# Patient Record
Sex: Female | Born: 1950
Health system: Southern US, Community
[De-identification: ages and names within clinical notes are randomized; demographics above are authoritative.]

## PROBLEM LIST (undated history)

## (undated) DIAGNOSIS — E119 Type 2 diabetes mellitus without complications: Secondary | ICD-10-CM

## (undated) DIAGNOSIS — I639 Cerebral infarction, unspecified: Secondary | ICD-10-CM

## (undated) DIAGNOSIS — R569 Unspecified convulsions: Secondary | ICD-10-CM

## (undated) DIAGNOSIS — Z87442 Personal history of urinary calculi: Secondary | ICD-10-CM

## (undated) DIAGNOSIS — G43909 Migraine, unspecified, not intractable, without status migrainosus: Secondary | ICD-10-CM

## (undated) DIAGNOSIS — F32A Depression, unspecified: Secondary | ICD-10-CM

## (undated) DIAGNOSIS — E7800 Pure hypercholesterolemia, unspecified: Secondary | ICD-10-CM

## (undated) DIAGNOSIS — M48061 Spinal stenosis, lumbar region without neurogenic claudication: Secondary | ICD-10-CM

## (undated) DIAGNOSIS — J45909 Unspecified asthma, uncomplicated: Secondary | ICD-10-CM

## (undated) DIAGNOSIS — C4491 Basal cell carcinoma of skin, unspecified: Secondary | ICD-10-CM

## (undated) DIAGNOSIS — M1990 Unspecified osteoarthritis, unspecified site: Secondary | ICD-10-CM

## (undated) DIAGNOSIS — I10 Essential (primary) hypertension: Secondary | ICD-10-CM

## (undated) DIAGNOSIS — D329 Benign neoplasm of meninges, unspecified: Secondary | ICD-10-CM

## (undated) DIAGNOSIS — F329 Major depressive disorder, single episode, unspecified: Secondary | ICD-10-CM

## (undated) DIAGNOSIS — K219 Gastro-esophageal reflux disease without esophagitis: Secondary | ICD-10-CM

## (undated) HISTORY — DX: Benign neoplasm of meninges, unspecified: D32.9

## (undated) HISTORY — PX: FRACTURE SURGERY: SHX138

## (undated) HISTORY — PX: COLONOSCOPY: SHX174

## (undated) HISTORY — PX: LUMBAR DISC SURGERY: SHX700

---

## 1996-11-14 HISTORY — PX: BACK SURGERY: SHX140

## 1998-10-01 ENCOUNTER — Other Ambulatory Visit: Admission: RE | Admit: 1998-10-01 | Discharge: 1998-10-01 | Payer: Self-pay | Admitting: Gynecology

## 1999-10-21 ENCOUNTER — Other Ambulatory Visit: Admission: RE | Admit: 1999-10-21 | Discharge: 1999-10-21 | Payer: Self-pay | Admitting: Gynecology

## 2000-12-28 ENCOUNTER — Other Ambulatory Visit: Admission: RE | Admit: 2000-12-28 | Discharge: 2000-12-28 | Payer: Self-pay | Admitting: Gynecology

## 2002-02-26 ENCOUNTER — Other Ambulatory Visit: Admission: RE | Admit: 2002-02-26 | Discharge: 2002-02-26 | Payer: Self-pay | Admitting: Gynecology

## 2003-04-22 ENCOUNTER — Other Ambulatory Visit: Admission: RE | Admit: 2003-04-22 | Discharge: 2003-04-22 | Payer: Self-pay | Admitting: Gynecology

## 2004-10-11 ENCOUNTER — Other Ambulatory Visit: Admission: RE | Admit: 2004-10-11 | Discharge: 2004-10-11 | Payer: Self-pay | Admitting: Gynecology

## 2007-04-03 ENCOUNTER — Ambulatory Visit (HOSPITAL_BASED_OUTPATIENT_CLINIC_OR_DEPARTMENT_OTHER): Admission: RE | Admit: 2007-04-03 | Discharge: 2007-04-03 | Payer: Self-pay | Admitting: Orthopaedic Surgery

## 2009-12-27 ENCOUNTER — Encounter: Admission: RE | Admit: 2009-12-27 | Discharge: 2009-12-27 | Payer: Self-pay | Admitting: Rheumatology

## 2010-01-01 ENCOUNTER — Encounter: Admission: RE | Admit: 2010-01-01 | Discharge: 2010-01-01 | Payer: Self-pay | Admitting: Rheumatology

## 2010-03-05 ENCOUNTER — Encounter: Admission: RE | Admit: 2010-03-05 | Discharge: 2010-03-05 | Payer: Self-pay | Admitting: Rheumatology

## 2010-12-05 ENCOUNTER — Encounter: Payer: Self-pay | Admitting: Rheumatology

## 2011-03-29 NOTE — Op Note (Signed)
NAMESYONA, Bonnie Cook                  ACCOUNT NO.:  [id number]   MEDICAL RECORD NO.:  [id number]          PATIENT TYPE:  AMB   LOCATION:  DSC                          FACILITY:  [facility]   PHYSICIAN:  Lubertha Basque. Dalldorf, M.D.DATE OF BIRTH:  1951/08/07   DATE OF PROCEDURE:  04/03/2007  DATE OF DISCHARGE:                               OPERATIVE REPORT   PREOPERATIVE DIAGNOSIS:  Right fifth carpometacarpal dislocation.   POSTOPERATIVE DIAGNOSIS:  Right fifth carpometacarpal dislocation.   PROCEDURE:  Closed reduction and pinning right fifth carpometacarpal  dislocation.   ANESTHESIA:  General.   ATTENDING SURGEON:  Lubertha Basque. Jerl Santos, M.D.   ASSISTANT:  Lindwood Qua, P.A.   INDICATIONS FOR PROCEDURE:  The patient is a 60 year old woman who fell  a few days ago in a dog related injury and injured her right dominant  hand.  She was seen at an outside facility and noted to have an  abnormality of the hand.  We took some x-rays yesterday in the office  and it looked as though she had a complete fifth CMC dislocation.  She  is offered closed reduction versus open reduction and pinning in hopes  of reestablishing the congruity of this joint.  Informed operative  consent was obtained after discussion of possible complications of  reaction to anesthesia, infection, neurovascular injury, and loss of  reduction.   SUMMARY OF FINDINGS AND PROCEDURE:  Under general anesthesia, closed  reduction and pinning was done of a volarly displaced fifth CMC  dislocation.  This reduced fairly easily and was stabilized with two K-  wires.  We used fluoroscopy throughout the case to make appropriate  intraoperative decisions and read all these views myself.   DESCRIPTION OF PROCEDURE:  The patient was taken to the operating suite  where general anesthetic was applied without difficulty.  She was  positioned supine and prepped, draped in normal sterile fashion.  After  the administration of preop IV  Kefzol, a closed reduction of her  dislocation was performed.  We then stabilized this percutaneously with  two of the 0.045 K-wires.  One was placed from the fifth metacarpal base  into the fourth metacarpal base and a second was placed diagonally  through the base of the fifth metacarpal into the carpus.  Fluoroscopy  was used to confirm adequate reduction of the fracture and placement of  the pins.  We performed these views in several planes.  The normal  rotation of her finger was restored.  The pins were bent outside the  skin and padded with Xeroform.  We then placed some dry gauze and a  volar splint of plaster with wrist in slight extension but the fingers  all free from the MCP joints distal.  Estimated blood loss and  intraoperative fluids can be obtained from anesthesia records.  No  tourniquet was placed or inflated.   DISPOSITION:  The patient was extubated in the operating room and taken  to recovery room in stable addition.  She was to go home same-day and  follow up in the office in less than a  week.  I will contact her by  phone tonight.      Lubertha Basque Jerl Santos, M.D.  Electronically Signed     PGD/MEDQ  D:  04/03/2007  T:  04/03/2007  Job:  [id number]

## 2012-11-14 HISTORY — PX: FINGER FRACTURE SURGERY: SHX638

## 2014-11-20 DIAGNOSIS — E119 Type 2 diabetes mellitus without complications: Secondary | ICD-10-CM | POA: Diagnosis not present

## 2014-12-02 DIAGNOSIS — J40 Bronchitis, not specified as acute or chronic: Secondary | ICD-10-CM | POA: Diagnosis not present

## 2014-12-02 DIAGNOSIS — N3001 Acute cystitis with hematuria: Secondary | ICD-10-CM | POA: Diagnosis not present

## 2014-12-02 DIAGNOSIS — Z23 Encounter for immunization: Secondary | ICD-10-CM | POA: Diagnosis not present

## 2014-12-02 DIAGNOSIS — J0101 Acute recurrent maxillary sinusitis: Secondary | ICD-10-CM | POA: Diagnosis not present

## 2014-12-02 DIAGNOSIS — E538 Deficiency of other specified B group vitamins: Secondary | ICD-10-CM | POA: Diagnosis not present

## 2014-12-21 DIAGNOSIS — N3001 Acute cystitis with hematuria: Secondary | ICD-10-CM | POA: Diagnosis not present

## 2014-12-21 DIAGNOSIS — R300 Dysuria: Secondary | ICD-10-CM | POA: Diagnosis not present

## 2015-01-01 DIAGNOSIS — J40 Bronchitis, not specified as acute or chronic: Secondary | ICD-10-CM | POA: Diagnosis not present

## 2015-01-01 DIAGNOSIS — E538 Deficiency of other specified B group vitamins: Secondary | ICD-10-CM | POA: Diagnosis not present

## 2015-01-29 DIAGNOSIS — E538 Deficiency of other specified B group vitamins: Secondary | ICD-10-CM | POA: Diagnosis not present

## 2015-01-29 DIAGNOSIS — I10 Essential (primary) hypertension: Secondary | ICD-10-CM | POA: Diagnosis not present

## 2015-01-29 DIAGNOSIS — E1165 Type 2 diabetes mellitus with hyperglycemia: Secondary | ICD-10-CM | POA: Diagnosis not present

## 2015-01-29 DIAGNOSIS — M8580 Other specified disorders of bone density and structure, unspecified site: Secondary | ICD-10-CM | POA: Diagnosis not present

## 2015-01-29 DIAGNOSIS — F329 Major depressive disorder, single episode, unspecified: Secondary | ICD-10-CM | POA: Diagnosis not present

## 2015-01-29 DIAGNOSIS — E559 Vitamin D deficiency, unspecified: Secondary | ICD-10-CM | POA: Diagnosis not present

## 2015-01-29 DIAGNOSIS — J4520 Mild intermittent asthma, uncomplicated: Secondary | ICD-10-CM | POA: Diagnosis not present

## 2015-01-29 DIAGNOSIS — E782 Mixed hyperlipidemia: Secondary | ICD-10-CM | POA: Diagnosis not present

## 2015-02-13 DIAGNOSIS — E119 Type 2 diabetes mellitus without complications: Secondary | ICD-10-CM | POA: Diagnosis not present

## 2015-04-30 DIAGNOSIS — J40 Bronchitis, not specified as acute or chronic: Secondary | ICD-10-CM | POA: Diagnosis not present

## 2015-04-30 DIAGNOSIS — J329 Chronic sinusitis, unspecified: Secondary | ICD-10-CM | POA: Diagnosis not present

## 2015-05-20 DIAGNOSIS — E119 Type 2 diabetes mellitus without complications: Secondary | ICD-10-CM | POA: Diagnosis not present

## 2015-06-27 DIAGNOSIS — H25813 Combined forms of age-related cataract, bilateral: Secondary | ICD-10-CM | POA: Diagnosis not present

## 2015-06-27 DIAGNOSIS — E119 Type 2 diabetes mellitus without complications: Secondary | ICD-10-CM | POA: Diagnosis not present

## 2015-06-27 DIAGNOSIS — H5203 Hypermetropia, bilateral: Secondary | ICD-10-CM | POA: Diagnosis not present

## 2015-07-13 DIAGNOSIS — R600 Localized edema: Secondary | ICD-10-CM | POA: Diagnosis not present

## 2015-07-13 DIAGNOSIS — R609 Edema, unspecified: Secondary | ICD-10-CM | POA: Diagnosis not present

## 2015-07-24 DIAGNOSIS — Z1211 Encounter for screening for malignant neoplasm of colon: Secondary | ICD-10-CM | POA: Diagnosis not present

## 2015-08-04 DIAGNOSIS — Z1231 Encounter for screening mammogram for malignant neoplasm of breast: Secondary | ICD-10-CM | POA: Diagnosis not present

## 2015-08-04 DIAGNOSIS — N898 Other specified noninflammatory disorders of vagina: Secondary | ICD-10-CM | POA: Diagnosis not present

## 2015-08-04 DIAGNOSIS — Z124 Encounter for screening for malignant neoplasm of cervix: Secondary | ICD-10-CM | POA: Diagnosis not present

## 2015-08-04 DIAGNOSIS — Z01419 Encounter for gynecological examination (general) (routine) without abnormal findings: Secondary | ICD-10-CM | POA: Diagnosis not present

## 2015-08-04 DIAGNOSIS — N9489 Other specified conditions associated with female genital organs and menstrual cycle: Secondary | ICD-10-CM | POA: Diagnosis not present

## 2015-08-04 DIAGNOSIS — D281 Benign neoplasm of vagina: Secondary | ICD-10-CM | POA: Diagnosis not present

## 2015-08-04 DIAGNOSIS — Z6828 Body mass index (BMI) 28.0-28.9, adult: Secondary | ICD-10-CM | POA: Diagnosis not present

## 2015-08-20 DIAGNOSIS — E119 Type 2 diabetes mellitus without complications: Secondary | ICD-10-CM | POA: Diagnosis not present

## 2015-08-25 DIAGNOSIS — Z9181 History of falling: Secondary | ICD-10-CM | POA: Diagnosis not present

## 2015-08-25 DIAGNOSIS — F329 Major depressive disorder, single episode, unspecified: Secondary | ICD-10-CM | POA: Diagnosis not present

## 2015-08-25 DIAGNOSIS — I10 Essential (primary) hypertension: Secondary | ICD-10-CM | POA: Diagnosis not present

## 2015-08-25 DIAGNOSIS — E1165 Type 2 diabetes mellitus with hyperglycemia: Secondary | ICD-10-CM | POA: Diagnosis not present

## 2015-08-25 DIAGNOSIS — E782 Mixed hyperlipidemia: Secondary | ICD-10-CM | POA: Diagnosis not present

## 2015-08-25 DIAGNOSIS — Z23 Encounter for immunization: Secondary | ICD-10-CM | POA: Diagnosis not present

## 2015-12-22 DIAGNOSIS — J329 Chronic sinusitis, unspecified: Secondary | ICD-10-CM | POA: Diagnosis not present

## 2015-12-22 DIAGNOSIS — J40 Bronchitis, not specified as acute or chronic: Secondary | ICD-10-CM | POA: Diagnosis not present

## 2015-12-22 DIAGNOSIS — Z1389 Encounter for screening for other disorder: Secondary | ICD-10-CM | POA: Diagnosis not present

## 2016-03-01 DIAGNOSIS — I10 Essential (primary) hypertension: Secondary | ICD-10-CM | POA: Diagnosis not present

## 2016-03-01 DIAGNOSIS — J069 Acute upper respiratory infection, unspecified: Secondary | ICD-10-CM | POA: Diagnosis not present

## 2016-03-01 DIAGNOSIS — J40 Bronchitis, not specified as acute or chronic: Secondary | ICD-10-CM | POA: Diagnosis not present

## 2016-03-08 DIAGNOSIS — J189 Pneumonia, unspecified organism: Secondary | ICD-10-CM | POA: Diagnosis not present

## 2016-03-30 DIAGNOSIS — Z0000 Encounter for general adult medical examination without abnormal findings: Secondary | ICD-10-CM | POA: Diagnosis not present

## 2016-03-30 DIAGNOSIS — M2550 Pain in unspecified joint: Secondary | ICD-10-CM | POA: Diagnosis not present

## 2016-03-30 DIAGNOSIS — J159 Unspecified bacterial pneumonia: Secondary | ICD-10-CM | POA: Diagnosis not present

## 2016-08-01 DIAGNOSIS — R399 Unspecified symptoms and signs involving the genitourinary system: Secondary | ICD-10-CM | POA: Diagnosis not present

## 2016-09-10 DIAGNOSIS — E119 Type 2 diabetes mellitus without complications: Secondary | ICD-10-CM | POA: Diagnosis not present

## 2016-09-10 DIAGNOSIS — H524 Presbyopia: Secondary | ICD-10-CM | POA: Diagnosis not present

## 2016-09-10 DIAGNOSIS — H25813 Combined forms of age-related cataract, bilateral: Secondary | ICD-10-CM | POA: Diagnosis not present

## 2016-09-23 DIAGNOSIS — J0120 Acute ethmoidal sinusitis, unspecified: Secondary | ICD-10-CM | POA: Diagnosis not present

## 2016-10-12 DIAGNOSIS — Z124 Encounter for screening for malignant neoplasm of cervix: Secondary | ICD-10-CM | POA: Diagnosis not present

## 2016-10-12 DIAGNOSIS — Z01419 Encounter for gynecological examination (general) (routine) without abnormal findings: Secondary | ICD-10-CM | POA: Diagnosis not present

## 2016-10-12 DIAGNOSIS — Z6831 Body mass index (BMI) 31.0-31.9, adult: Secondary | ICD-10-CM | POA: Diagnosis not present

## 2016-10-12 DIAGNOSIS — Z1389 Encounter for screening for other disorder: Secondary | ICD-10-CM | POA: Diagnosis not present

## 2016-10-18 DIAGNOSIS — Z23 Encounter for immunization: Secondary | ICD-10-CM | POA: Diagnosis not present

## 2016-10-21 DIAGNOSIS — Z1231 Encounter for screening mammogram for malignant neoplasm of breast: Secondary | ICD-10-CM | POA: Diagnosis not present

## 2016-12-21 DIAGNOSIS — E1165 Type 2 diabetes mellitus with hyperglycemia: Secondary | ICD-10-CM | POA: Diagnosis not present

## 2016-12-21 DIAGNOSIS — E538 Deficiency of other specified B group vitamins: Secondary | ICD-10-CM | POA: Diagnosis not present

## 2016-12-21 DIAGNOSIS — I10 Essential (primary) hypertension: Secondary | ICD-10-CM | POA: Diagnosis not present

## 2016-12-21 DIAGNOSIS — F329 Major depressive disorder, single episode, unspecified: Secondary | ICD-10-CM | POA: Diagnosis not present

## 2016-12-21 DIAGNOSIS — E782 Mixed hyperlipidemia: Secondary | ICD-10-CM | POA: Diagnosis not present

## 2016-12-21 DIAGNOSIS — M2550 Pain in unspecified joint: Secondary | ICD-10-CM | POA: Diagnosis not present

## 2016-12-21 DIAGNOSIS — E559 Vitamin D deficiency, unspecified: Secondary | ICD-10-CM | POA: Diagnosis not present

## 2016-12-21 DIAGNOSIS — M8580 Other specified disorders of bone density and structure, unspecified site: Secondary | ICD-10-CM | POA: Diagnosis not present

## 2016-12-21 DIAGNOSIS — Z1389 Encounter for screening for other disorder: Secondary | ICD-10-CM | POA: Diagnosis not present

## 2016-12-21 DIAGNOSIS — Z9181 History of falling: Secondary | ICD-10-CM | POA: Diagnosis not present

## 2017-01-22 ENCOUNTER — Encounter (HOSPITAL_COMMUNITY): Payer: Self-pay

## 2017-01-22 ENCOUNTER — Emergency Department (HOSPITAL_COMMUNITY): Payer: PPO

## 2017-01-22 ENCOUNTER — Observation Stay (HOSPITAL_COMMUNITY)
Admission: EM | Admit: 2017-01-22 | Discharge: 2017-01-23 | Disposition: A | Discharge status: Home / Self Care | Payer: PPO | Attending: Internal Medicine | Admitting: Internal Medicine

## 2017-01-22 DIAGNOSIS — J45909 Unspecified asthma, uncomplicated: Secondary | ICD-10-CM | POA: Insufficient documentation

## 2017-01-22 DIAGNOSIS — Z7982 Long term (current) use of aspirin: Secondary | ICD-10-CM | POA: Insufficient documentation

## 2017-01-22 DIAGNOSIS — Z8673 Personal history of transient ischemic attack (TIA), and cerebral infarction without residual deficits: Secondary | ICD-10-CM | POA: Insufficient documentation

## 2017-01-22 DIAGNOSIS — Z6834 Body mass index (BMI) 34.0-34.9, adult: Secondary | ICD-10-CM | POA: Insufficient documentation

## 2017-01-22 DIAGNOSIS — E785 Hyperlipidemia, unspecified: Secondary | ICD-10-CM | POA: Insufficient documentation

## 2017-01-22 DIAGNOSIS — E119 Type 2 diabetes mellitus without complications: Secondary | ICD-10-CM

## 2017-01-22 DIAGNOSIS — Z880 Allergy status to penicillin: Secondary | ICD-10-CM | POA: Insufficient documentation

## 2017-01-22 DIAGNOSIS — Z823 Family history of stroke: Secondary | ICD-10-CM | POA: Insufficient documentation

## 2017-01-22 DIAGNOSIS — R4702 Dysphasia: Secondary | ICD-10-CM | POA: Diagnosis not present

## 2017-01-22 DIAGNOSIS — E7800 Pure hypercholesterolemia, unspecified: Secondary | ICD-10-CM | POA: Diagnosis not present

## 2017-01-22 DIAGNOSIS — R4701 Aphasia: Secondary | ICD-10-CM | POA: Insufficient documentation

## 2017-01-22 DIAGNOSIS — R4789 Other speech disturbances: Secondary | ICD-10-CM

## 2017-01-22 DIAGNOSIS — E669 Obesity, unspecified: Secondary | ICD-10-CM | POA: Diagnosis not present

## 2017-01-22 DIAGNOSIS — R2681 Unsteadiness on feet: Secondary | ICD-10-CM | POA: Insufficient documentation

## 2017-01-22 DIAGNOSIS — R51 Headache: Secondary | ICD-10-CM | POA: Diagnosis not present

## 2017-01-22 DIAGNOSIS — I10 Essential (primary) hypertension: Secondary | ICD-10-CM | POA: Diagnosis present

## 2017-01-22 DIAGNOSIS — G459 Transient cerebral ischemic attack, unspecified: Secondary | ICD-10-CM | POA: Diagnosis not present

## 2017-01-22 DIAGNOSIS — G4733 Obstructive sleep apnea (adult) (pediatric): Secondary | ICD-10-CM | POA: Diagnosis not present

## 2017-01-22 DIAGNOSIS — Z794 Long term (current) use of insulin: Secondary | ICD-10-CM | POA: Insufficient documentation

## 2017-01-22 DIAGNOSIS — R479 Unspecified speech disturbances: Secondary | ICD-10-CM | POA: Diagnosis not present

## 2017-01-22 HISTORY — DX: Unspecified asthma, uncomplicated: J45.909

## 2017-01-22 HISTORY — DX: Pure hypercholesterolemia, unspecified: E78.00

## 2017-01-22 HISTORY — DX: Essential (primary) hypertension: I10

## 2017-01-22 LAB — I-STAT CHEM 8, ED
BUN: 29 mg/dL — ABNORMAL HIGH (ref 6–20)
CALCIUM ION: 1.16 mmol/L (ref 1.15–1.40)
CHLORIDE: 99 mmol/L — AB (ref 101–111)
Creatinine, Ser: 1.2 mg/dL — ABNORMAL HIGH (ref 0.44–1.00)
GLUCOSE: 135 mg/dL — AB (ref 65–99)
HCT: 38 % (ref 36.0–46.0)
Hemoglobin: 12.9 g/dL (ref 12.0–15.0)
Potassium: 4.2 mmol/L (ref 3.5–5.1)
SODIUM: 138 mmol/L (ref 135–145)
TCO2: 28 mmol/L (ref 0–100)

## 2017-01-22 LAB — COMPREHENSIVE METABOLIC PANEL
ALK PHOS: 56 U/L (ref 38–126)
ALT: 19 U/L (ref 14–54)
ANION GAP: 10 (ref 5–15)
AST: 33 U/L (ref 15–41)
Albumin: 3.5 g/dL (ref 3.5–5.0)
BILIRUBIN TOTAL: 0.7 mg/dL (ref 0.3–1.2)
BUN: 25 mg/dL — ABNORMAL HIGH (ref 6–20)
CALCIUM: 9.2 mg/dL (ref 8.9–10.3)
CO2: 26 mmol/L (ref 22–32)
Chloride: 100 mmol/L — ABNORMAL LOW (ref 101–111)
Creatinine, Ser: 1.13 mg/dL — ABNORMAL HIGH (ref 0.44–1.00)
GFR, EST AFRICAN AMERICAN: 57 mL/min — AB (ref >60)
GFR, EST NON AFRICAN AMERICAN: 49 mL/min — AB (ref >60)
Glucose, Bld: 137 mg/dL — ABNORMAL HIGH (ref 65–99)
POTASSIUM: 4.5 mmol/L (ref 3.5–5.1)
Sodium: 136 mmol/L (ref 135–145)
TOTAL PROTEIN: 6.8 g/dL (ref 6.5–8.1)

## 2017-01-22 LAB — DIFFERENTIAL
Basophils Absolute: 0 10*3/uL (ref 0.0–0.1)
Basophils Relative: 1 %
EOS ABS: 0.2 10*3/uL (ref 0.0–0.7)
EOS PCT: 4 %
LYMPHS ABS: 1.3 10*3/uL (ref 0.7–4.0)
LYMPHS PCT: 24 %
MONO ABS: 0.5 10*3/uL (ref 0.1–1.0)
MONOS PCT: 10 %
NEUTROS PCT: 61 %
Neutro Abs: 3.2 10*3/uL (ref 1.7–7.7)

## 2017-01-22 LAB — CBC
HEMATOCRIT: 38.1 % (ref 36.0–46.0)
HEMOGLOBIN: 12.4 g/dL (ref 12.0–15.0)
MCH: 29.2 pg (ref 26.0–34.0)
MCHC: 32.5 g/dL (ref 30.0–36.0)
MCV: 89.6 fL (ref 78.0–100.0)
Platelets: 239 10*3/uL (ref 150–400)
RBC: 4.25 MIL/uL (ref 3.87–5.11)
RDW: 14.5 % (ref 11.5–15.5)
WBC: 5.3 10*3/uL (ref 4.0–10.5)

## 2017-01-22 LAB — I-STAT TROPONIN, ED: TROPONIN I, POC: 0 ng/mL (ref 0.00–0.08)

## 2017-01-22 LAB — APTT: aPTT: 30 seconds (ref 24–36)

## 2017-01-22 LAB — PROTIME-INR
INR: 0.91
Prothrombin Time: 12.2 seconds (ref 11.4–15.2)

## 2017-01-22 LAB — CBG MONITORING, ED: GLUCOSE-CAPILLARY: 128 mg/dL — AB (ref 65–99)

## 2017-01-22 MED ORDER — DIPHENHYDRAMINE HCL 50 MG/ML IJ SOLN
25.0000 mg | Freq: Once | INTRAMUSCULAR | Status: AC
  Administered 2017-01-22: 25 mg via INTRAVENOUS
  Filled 2017-01-22: qty 1

## 2017-01-22 MED ORDER — PROCHLORPERAZINE EDISYLATE 5 MG/ML IJ SOLN
5.0000 mg | Freq: Once | INTRAMUSCULAR | Status: AC
Start: 2017-01-22 — End: 2017-01-22
  Administered 2017-01-22: 5 mg via INTRAVENOUS
  Filled 2017-01-22: qty 2

## 2017-01-22 MED ORDER — ACETAMINOPHEN 325 MG PO TABS
650.0000 mg | ORAL_TABLET | Freq: Once | ORAL | Status: AC
  Administered 2017-01-22: 650 mg via ORAL
  Filled 2017-01-22: qty 2

## 2017-01-22 MED ORDER — SODIUM CHLORIDE 0.9 % IV BOLUS (SEPSIS)
1000.0000 mL | Freq: Once | INTRAVENOUS | Status: AC
  Administered 2017-01-22: 1000 mL via INTRAVENOUS

## 2017-01-22 NOTE — ED Provider Notes (Signed)
[facility] DEPT Provider Note   CSN: [id number] Arrival date & time: 01/22/17  2100     History   Chief Complaint Chief Complaint  Patient presents with  . Headache    HPI Bonnie Cook is a 66 y.o. female.  HPI   Patient is 66 year old female with hypertension hypercholesterolemia and diabetes presenting today with difficulty speaking and headache. Patient's daughter was on the phone with her when patient was noted to have difficulty speaking. She also had some numbness in the right hand.  On arrival here patient's symptoms completely resolved but still had mild residual headache. Patient initially called code stroke but with no residual symptoms code stroke canceled.  Past Medical History:  Diagnosis Date  . Asthma   . Diabetes mellitus without complication ([place])   . Hypercholesteremia   . Hypertension     There are no active problems to display for this patient.   Past Surgical History:  Procedure Laterality Date  . BACK SURGERY      OB History    No data available       Home Medications    Prior to Admission medications   Medication Sig Start Date End Date Taking? Authorizing Provider  aspirin EC 81 MG tablet Take 81 mg by mouth daily.   Yes Historical Provider, MD  CALCIUM PO Take 2 tablets by mouth daily.   Yes Historical Provider, MD  citalopram (CELEXA) 20 MG tablet Take 20 mg by mouth daily. 01/18/17  Yes Historical Provider, MD  colestipol (COLESTID) 1 g tablet Take 1 g by mouth 2 (two) times daily. 01/18/17  Yes Historical Provider, MD  CRANBERRY PO Take 1 tablet by mouth daily.   Yes Historical Provider, MD  cyanocobalamin (,VITAMIN B-12,) 1000 MCG/ML injection Inject 1,000 mcg into the muscle every 30 (thirty) days.  01/18/17  Yes Historical Provider, MD  lisinopril (PRINIVIL,ZESTRIL) 10 MG tablet Take 10 mg by mouth every evening. 01/18/17  Yes Historical Provider, MD  loratadine (CLARITIN) 10 MG tablet Take 10 mg by mouth daily.   Yes Historical  Provider, MD  meloxicam (MOBIC) 15 MG tablet Take 15 mg by mouth daily. 01/03/17  Yes Historical Provider, MD  metFORMIN (GLUCOPHAGE) 850 MG tablet Take 850 mg by mouth 2 (two) times daily. 01/18/17  Yes Historical Provider, MD  Multiple Vitamin (MULTIVITAMIN WITH MINERALS) TABS tablet Take 1 tablet by mouth daily.   Yes Historical Provider, MD  pioglitazone (ACTOS) 30 MG tablet Take 30 mg by mouth 2 (two) times daily. 01/18/17  Yes Historical Provider, MD  Vitamin D, Ergocalciferol, (DRISDOL) 50000 units CAPS capsule Take 50,000 Units by mouth 2 (two) times a week. On Monday and Thursday 01/18/17  Yes Historical Provider, MD    Family History No family history on file.  Social History Social History  Substance Use Topics  . Smoking status: Never Smoker  . Smokeless tobacco: Never Used  . Alcohol use No     Allergies   Penicillins   Review of Systems Review of Systems  Constitutional: Negative for activity change.  Respiratory: Negative for shortness of breath.   Cardiovascular: Negative for chest pain.  Gastrointestinal: Negative for abdominal pain.  Neurological: Positive for speech difficulty and headaches.  All other systems reviewed and are negative.    Physical Exam Updated Vital Signs BP 158/80   Pulse 86   Temp 98.6 F (37 C)   Resp 16   Ht 5\' 3"  (1.6 m)   Wt 178 lb 5.6 oz (  80.9 kg)   SpO2 100%   BMI 31.59 kg/m   Physical Exam  Constitutional: She is oriented to person, place, and time. She appears well-developed and well-nourished.  HENT:  Head: Normocephalic and atraumatic.  Eyes: Right eye exhibits no discharge.  Cardiovascular: Normal rate, regular rhythm and normal heart sounds.   No murmur heard. Pulmonary/Chest: Effort normal and breath sounds normal. She has no wheezes. She has no rales.  Abdominal: Soft. She exhibits no distension. There is no tenderness.  Neurological: She is oriented to person, place, and time.  Equal strength bilaterally upper  and lower extremities negative pronator drift. Normal sensation bilaterally. Speech comprehensible, no slurring. Facial nerve tested and appears grossly normal. Alert and oriented 3.   Skin: Skin is warm and dry. She is not diaphoretic.  Psychiatric: She has a normal mood and affect.  Nursing note and vitals reviewed.    ED Treatments / Results  Labs (all labs ordered are listed, but only abnormal results are displayed) Labs Reviewed  COMPREHENSIVE METABOLIC PANEL - Abnormal; Notable for the following:       Result Value   Chloride 100 (*)    Glucose, Bld 137 (*)    BUN 25 (*)    Creatinine, Ser 1.13 (*)    GFR calc non Af Amer 49 (*)    GFR calc Af Amer 57 (*)    All other components within normal limits  CBG MONITORING, ED - Abnormal; Notable for the following:    Glucose-Capillary 128 (*)    All other components within normal limits  I-STAT CHEM 8, ED - Abnormal; Notable for the following:    Chloride 99 (*)    BUN 29 (*)    Creatinine, Ser 1.20 (*)    Glucose, Bld 135 (*)    All other components within normal limits  PROTIME-INR  APTT  CBC  DIFFERENTIAL  I-STAT TROPOININ, ED    EKG  EKG Interpretation  Date/Time:  Sunday January 22 2017 21:25:20 EDT Ventricular Rate:  86 PR Interval:    QRS Duration: 77 QT Interval:  348 QTC Calculation: 417 R Axis:   -17 Text Interpretation:  Sinus rhythm Atrial premature complex Borderline left axis deviation Low voltage, precordial leads Normal sinus rhythm Confirmed by Gerald Leitz ([id number]) on 01/22/2017 9:32:29 PM       Radiology Ct Head Code Stroke W/o Cm  Result Date: 01/22/2017 CLINICAL DATA:  Code stroke. Slurred speech. Right-sided headache. Right hand numbness. EXAM: CT HEAD WITHOUT CONTRAST TECHNIQUE: Contiguous axial images were obtained from the base of the skull through the vertex without intravenous contrast. COMPARISON:  None. FINDINGS: Brain: Moderate atrophy is present. Diffuse white matter disease is  asymmetric on the left. A lacunar infarct involving the anterior limb of the left internal capsule appears remote. The basal ganglia are otherwise intact. The insular ribbon is intact bilaterally. The brainstem and cerebellum are normal. No acute hemorrhage or mass lesion is present. The ventricles are proportionate to the degree of atrophy. There is ex vacuo dilation on the left associated with the remote lacunar infarct. No significant extra-axial fluid collection is present. Vascular: Calcifications are present within the cavernous internal carotid arteries. There is no hyperdense vessel. Calcifications are also present at dural margin of the vertebral arteries. Skull: The calvarium is intact. No focal lytic or blastic lesions are present. Sinuses/Orbits: Paranasal sinuses and mastoid air cells are clear. The globes and orbits are within normal limits. ASPECTS [facility] Stroke Program Early CT Score) -  Ganglionic level infarction (caudate, lentiform nuclei, internal capsule, insula, M1-M3 cortex): 7/7 - Supraganglionic infarction (M4-M6 cortex): 3/3 Total score (0-10 with 10 being normal): 10/10 IMPRESSION: 1. Remote infarct involving the anterior limb of the left internal capsule with associated ex vacuo dilation of the left lateral ventricle. 2. Moderate atrophy and white matter disease. 3. No acute intracranial abnormality. 4. ASPECTS is 10/10 These results were called by telephone at the time of interpretation on 01/22/2017 at 9:19 pm to Dr. Cheral Marker , who verbally acknowledged these results. Electronically Signed   By: San Morelle M.D.   On: 01/22/2017 21:19    Procedures Procedures (including critical care time)  Medications Ordered in ED Medications  prochlorperazine (COMPAZINE) injection 5 mg (5 mg Intravenous Given 01/22/17 2153)  sodium chloride 0.9 % bolus 1,000 mL (1,000 mLs Intravenous New Bag/Given 01/22/17 2151)  acetaminophen (TYLENOL) tablet 650 mg (650 mg Oral Given 01/22/17 2154)    diphenhydrAMINE (BENADRYL) injection 25 mg (25 mg Intravenous Given 01/22/17 2152)     Initial Impression / Assessment and Plan / ED Course  I have reviewed the triage vital signs and the nursing notes.  Pertinent labs & imaging results that were available during my care of the patient were reviewed by me and considered in my medical decision making (see chart for details).     She well-appearing 39 feet-year-old female with hypertension hypercholesteremia and diabetes presenting with neuro symptoms are resolved. Patient had strokelike symptoms earlier today at 8 PM. The resolve. Symptoms were difficulty with speech and right hand numbness. Patient now has residual headache. Unclear whether this was a complex migraine. Patient has no history of migraines in the past. We'll treat patient's headache now and admit for TIA workup.  10:08 PM Discussed with Linzen with neurology, who will follow.    Final Clinical Impressions(s) / ED Diagnoses   Final diagnoses:  None    New Prescriptions New Prescriptions   No medications on file     Jeroline Wolbert Julio Alm, MD 01/22/17 2208

## 2017-01-22 NOTE — Consult Note (Signed)
Referring Physician: Dr. Thomasene Lot    Chief Complaint: Acute onset of aphasia  HPI: Bonnie Cook is an 66 y.o. female who presents for the evaluation of transient aphasia. She was LKN at 76 while speaking with her daughter over the phone. At [place] her daughter noticed the patient's speech was hesitant with "trouble finding words"; unclear if this evolved from normal during their conversation or was sudden. Her daughter rushed over to her house and the speech deficit was still present. There was no dysarthria. The patient also began to experience right hand numbness and headache at about 2015. By the time she arrived at the ED, all of her symptoms had resolved except for her headache. She states that the headache is still present, is bifrontal, nonthrobbing, with pain rated as 2/10.   CT head was obtained, revealing no acute abnormality. A small remote infarct involving the anterior limb of the left internal capsule is seen. Moderate atrophy and white matter disease are also noted.   EKG shows no atrial fibrillation.   Her PMHx includes hypercholesterolemia, HTN, DM and asthma. She has no prior history of stroke or MI. She takes ASA daily.   LSN: [id number] tPA Given: No: Symptoms resolved.  Past Medical History:  Diagnosis Date  . Asthma   . Diabetes mellitus without complication ([place])   . Hypercholesteremia   . Hypertension     Past Surgical History:  Procedure Laterality Date  . BACK SURGERY      No family history on file. Social History:  reports that she has never smoked. She has never used smokeless tobacco. She reports that she does not drink alcohol or use drugs.  Allergies:  Allergies  Allergen Reactions  . Penicillins Other (See Comments)    As a child, doesn't remember reaction.      Medications:  aspirin EC 81 MG tablet Take 81 mg by mouth daily. Historical Provider, MD Needs Review  CALCIUM PO Take 2 tablets by mouth daily. Historical Provider, MD Needs Review   citalopram (CELEXA) 20 MG tablet Take 20 mg by mouth daily. Historical Provider, MD Needs Review  colestipol (COLESTID) 1 g tablet Take 1 g by mouth 2 (two) times daily. Historical Provider, MD Needs Review  CRANBERRY PO Take 1 tablet by mouth daily. Historical Provider, MD Needs Review  cyanocobalamin (,VITAMIN B-12,) 1000 MCG/ML injection Inject 1,000 mcg into the muscle every 30 (thirty) days.  Historical Provider, MD Needs Review  lisinopril (PRINIVIL,ZESTRIL) 10 MG tablet Take 10 mg by mouth every evening. Historical Provider, MD Needs Review  loratadine (CLARITIN) 10 MG tablet Take 10 mg by mouth daily. Historical Provider, MD Needs Review  meloxicam (MOBIC) 15 MG tablet Take 15 mg by mouth daily. Historical Provider, MD Needs Review  metFORMIN (GLUCOPHAGE) 850 MG tablet Take 850 mg by mouth 2 (two) times daily. Historical Provider, MD Needs Review  Multiple Vitamin (MULTIVITAMIN WITH MINERALS) TABS tablet Take 1 tablet by mouth daily. Historical Provider, MD Needs Review  pioglitazone (ACTOS) 30 MG tablet Take 30 mg by mouth 2 (two) times daily. Historical Provider, MD Needs Review  Vitamin D, Ergocalciferol, (DRISDOL) 50000 units CAPS capsule Take 50,000 Units by mouth 2 (two) times a week. On Monday and Thursday Historical Provider, MD Needs Review   ROS: No CP, SOB, abdominal pain or vision loss. Other ROS as per HPI.   Physical Examination: Blood pressure 165/85, pulse 101, temperature 98.6 F (37 C), resp. rate 18, height _0  (1.6 m), weight 80.9 kg (  178 lb 5.6 oz), SpO2 95 %.  HEENT: [place]/AT Lungs: Respirations unlabored Ext: Warm and well perfused  Neurologic Examination: Mental Status: Alert, fully oriented, thought content appropriate.  Speech fluent with intact comprehension, naming and repetition. Able to follow all commands without difficulty. Cranial Nerves: II:  Visual fields intact III,IV, VI: ptosis not present, EOMI without nystagmus V,VII: smile symmetric, facial  temperature sensation decreased on right VIII: hearing intact to conversation IX,X: No hypophonia XI: bilateral shoulder shrug is symmetric XII: midline tongue extension Motor: Right : Upper extremity   5/5    Left:     Upper extremity   4+/5 deltoid and grip, otherwise 5/5  Lower extremity   5/5     Lower extremity   5/5 Normal tone throughout; no atrophy noted Sensory: Decreased temperature sensation RUE. Otherwise, temp intact. Light touch intact x 4. No extinction.  Deep Tendon Reflexes:  2+ bilateral brachioradialis and biceps.  2+ patellae and achilles bilaterally. Toes downgoing bilaterally.  Cerebellar: No ataxia with FNF bilaterally Gait: Deferred.   Results for orders placed or performed during the hospital encounter of 01/22/17 (from the past 48 hour(s))  Protime-INR     Status: None   Collection Time: 01/22/17  9:16 PM  Result Value Ref Range   Prothrombin Time 12.2 11.4 - 15.2 seconds   INR 0.91   APTT     Status: None   Collection Time: 01/22/17  9:16 PM  Result Value Ref Range   aPTT 30 24 - 36 seconds  CBC     Status: None   Collection Time: 01/22/17  9:16 PM  Result Value Ref Range   WBC 5.3 4.0 - 10.5 K/uL   RBC 4.25 3.87 - 5.11 MIL/uL   Hemoglobin 12.4 12.0 - 15.0 g/dL   HCT 38.1 36.0 - 46.0 %   MCV 89.6 78.0 - 100.0 fL   MCH 29.2 26.0 - 34.0 pg   MCHC 32.5 30.0 - 36.0 g/dL   RDW 14.5 11.5 - 15.5 %   Platelets 239 150 - 400 K/uL  Differential     Status: None   Collection Time: 01/22/17  9:16 PM  Result Value Ref Range   Neutrophils Relative % 61 %   Neutro Abs 3.2 1.7 - 7.7 K/uL   Lymphocytes Relative 24 %   Lymphs Abs 1.3 0.7 - 4.0 K/uL   Monocytes Relative 10 %   Monocytes Absolute 0.5 0.1 - 1.0 K/uL   Eosinophils Relative 4 %   Eosinophils Absolute 0.2 0.0 - 0.7 K/uL   Basophils Relative 1 %   Basophils Absolute 0.0 0.0 - 0.1 K/uL  Comprehensive metabolic panel     Status: Abnormal   Collection Time: 01/22/17  9:16 PM  Result Value Ref  Range   Sodium 136 135 - 145 mmol/L   Potassium 4.5 3.5 - 5.1 mmol/L   Chloride 100 (L) 101 - 111 mmol/L   CO2 26 22 - 32 mmol/L   Glucose, Bld 137 (H) 65 - 99 mg/dL   BUN 25 (H) 6 - 20 mg/dL   Creatinine, Ser 1.13 (H) 0.44 - 1.00 mg/dL   Calcium 9.2 8.9 - 10.3 mg/dL   Total Protein 6.8 6.5 - 8.1 g/dL   Albumin 3.5 3.5 - 5.0 g/dL   AST 33 15 - 41 U/L   ALT 19 14 - 54 U/L   Alkaline Phosphatase 56 38 - 126 U/L   Total Bilirubin 0.7 0.3 - 1.2 mg/dL   GFR calc  non Af Amer 49 (L) >60 mL/min   GFR calc Af Amer 57 (L) >60 mL/min    Comment: (NOTE) The eGFR has been calculated using the CKD EPI equation. This calculation has not been validated in all clinical situations. eGFR's persistently <60 mL/min signify possible Chronic Kidney Disease.    Anion gap 10 5 - 15  CBG monitoring, ED     Status: Abnormal   Collection Time: 01/22/17  9:19 PM  Result Value Ref Range   Glucose-Capillary 128 (H) 65 - 99 mg/dL  I-stat troponin, ED     Status: None   Collection Time: 01/22/17  9:30 PM  Result Value Ref Range   Troponin i, poc 0.00 0.00 - 0.08 ng/mL   Comment 3            Comment: Due to the release kinetics of cTnI, a negative result within the first hours of the onset of symptoms does not rule out myocardial infarction with certainty. If myocardial infarction is still suspected, repeat the test at appropriate intervals.   I-Stat Chem 8, ED     Status: Abnormal   Collection Time: 01/22/17  9:31 PM  Result Value Ref Range   Sodium 138 135 - 145 mmol/L   Potassium 4.2 3.5 - 5.1 mmol/L   Chloride 99 (L) 101 - 111 mmol/L   BUN 29 (H) 6 - 20 mg/dL   Creatinine, Ser 1.20 (H) 0.44 - 1.00 mg/dL   Glucose, Bld 135 (H) 65 - 99 mg/dL   Calcium, Ion 1.16 1.15 - 1.40 mmol/L   TCO2 28 0 - 100 mmol/L   Hemoglobin 12.9 12.0 - 15.0 g/dL   HCT 38.0 36.0 - 46.0 %   Ct Head Code Stroke W/o Cm  Result Date: 01/22/2017 CLINICAL DATA:  Code stroke. Slurred speech. Right-sided headache. Right hand  numbness. EXAM: CT HEAD WITHOUT CONTRAST TECHNIQUE: Contiguous axial images were obtained from the base of the skull through the vertex without intravenous contrast. COMPARISON:  None. FINDINGS: Brain: Moderate atrophy is present. Diffuse white matter disease is asymmetric on the left. A lacunar infarct involving the anterior limb of the left internal capsule appears remote. The basal ganglia are otherwise intact. The insular ribbon is intact bilaterally. The brainstem and cerebellum are normal. No acute hemorrhage or mass lesion is present. The ventricles are proportionate to the degree of atrophy. There is ex vacuo dilation on the left associated with the remote lacunar infarct. No significant extra-axial fluid collection is present. Vascular: Calcifications are present within the cavernous internal carotid arteries. There is no hyperdense vessel. Calcifications are also present at dural margin of the vertebral arteries. Skull: The calvarium is intact. No focal lytic or blastic lesions are present. Sinuses/Orbits: Paranasal sinuses and mastoid air cells are clear. The globes and orbits are within normal limits. ASPECTS [facility] Stroke Program Early CT Score) - Ganglionic level infarction (caudate, lentiform nuclei, internal capsule, insula, M1-M3 cortex): 7/7 - Supraganglionic infarction (M4-M6 cortex): 3/3 Total score (0-10 with 10 being normal): 10/10 IMPRESSION: 1. Remote infarct involving the anterior limb of the left internal capsule with associated ex vacuo dilation of the left lateral ventricle. 2. Moderate atrophy and white matter disease. 3. No acute intracranial abnormality. 4. ASPECTS is 10/10 These results were called by telephone at the time of interpretation on 01/22/2017 at 9:19 pm to Dr. Cheral Marker , who verbally acknowledged these results. Electronically Signed   By: San Morelle M.D.   On: 01/22/2017 21:19    Assessment:  66 y.o. female with acute onset of dysphasia followed by right hand  sensory numbness and headache. 1. DDx includes TIA involving the left MCA territory and complicated migraine. If TIA, then classifiable as having failed ASA.  2. CT head reveals no acute abnormality.   3. Prior stroke evident on CT head, despite patient's stated history being negative for stroke. The finding consists of a small remote infarct involving the anterior limb of the left internal capsule.  4. Stroke Risk Factors - DM, hypercholesterolemia and HTN 5. Elevated BUN/Cr, suggestive of volume depletion.  Plan: 1. MRI of the brain without contrast 2. MRA of the head and neck 3. Echocardiogram 4. PT consult, OT consult, Speech consult 5. Continue ASA for now. May switch to Plavix or Aggrenox pending results of work up.  6. Risk factor modification 7. Telemetry monitoring 8. Frequent neuro checks 9. HgbA1c, fasting lipid panel. Consider starting atorvastatin pending results of work up.  10. Permissive HTN x 24 hours.  _0  signed: Dr. Kerney Elbe 01/22/2017, 11:08 PM

## 2017-01-22 NOTE — ED Notes (Signed)
Pt arrived to nurse first at 2100.  Daughter states she was on the phone with pt at 8pm.  After talking with patient for about 10 minutes she started having trouble with her speech, developed a headache, and had R hand numbness.  All symptoms have currently resolved with the exception that she still has a headache.  No arm drift. No leg drift.  Speech is clear.  No facial droop.  Headache is across forehead.  Code Stroke activated and pt straight to Pod A for EDP airway clearance, Dr. Thomasene Lot cancelled Code Stroke, and pt to [place] with primary RN.

## 2017-01-22 NOTE — ED Notes (Signed)
Pt reports that headache is relieved after vomiting.

## 2017-01-22 NOTE — ED Triage Notes (Signed)
Code Stroke called in triage, to CT.  Code stroke cancelled by Dr. Thomasene Lot.  Onset 8pm pt was talking to daughter and started having aphasia.  Daughter went to pts house, checked BS 100, speech deficit was resolved, pt was c/o right hand numbness- that is resolved now.  C/o headache.  Pt told daughter that after church she slept all afternoon and felt really tired.  A&Ox4.

## 2017-01-22 NOTE — ED Notes (Signed)
Code stroke has been cancelled

## 2017-01-22 NOTE — H&P (Signed)
History and Physical    [name] BWG:[id number] DOB: 1951-10-07 DOA: 01/22/2017  PCP: Myrlene Broker, MD  Patient coming from: Home   Chief Complaint: word finding difficulty  HPI: Bonnie Cook is a 66 y.o. female with medical history significant of HTN, HLD, DM2, asthma who presents with word finding difficulty.  Patient reports that this afternoon while talking on the phone with her daughter she developed nonsense speech and word finding difficulty.  The patient reports that she felt like she was saying the right words, but she noticed that her daughter became upset with the way she was talking.  Her daughter went to Ms. Droz's home and at that time she developed right arm numbness feeling like it was asleep.  She denies weakness, facial droop.  The symptoms had resolved by the time she arrived at the ED. She reports taking all of her medication as prescribed. She takes her BP medication at night.  She has never had anything like this before.  She has a family history of TIA in her mother and stroke in her grandmother.  She takes a daily aspirin.  Other symptoms include headache behind her eyes, but no vision changes or blurry vision.  Denies nausea, vomiting, dysuria.   ED Course: In the ED, she was noted to have a mildly elevated Cr to 1.13, troponin I negative.  CT head showed a remote infarct and atrophy but nothing acute.  Neurology evaluated in the ED  Review of Systems: As per HPI otherwise 10 point review of systems negative.   Past Medical History:  Diagnosis Date  . Asthma   . Diabetes mellitus without complication ([place])   . Hypercholesteremia   . Hypertension     Past Surgical History:  Procedure Laterality Date  . BACK SURGERY     Confirmed with patient.    reports that she has never smoked. She has never used smokeless tobacco. She reports that she does not drink alcohol or use drugs.  Allergies  Allergen Reactions  . Penicillins Other (See Comments)    As a  child, doesn't remember reaction.     Reviewed with patient.  Family History  Problem Relation Age of Onset  . Transient ischemic attack Mother   . Heart disease Father   . Stroke Maternal Grandmother     Prior to Admission medications   Medication Sig Start Date End Date Taking? Authorizing Provider  aspirin EC 81 MG tablet Take 81 mg by mouth daily.   Yes Historical Provider, MD  CALCIUM PO Take 2 tablets by mouth daily.   Yes Historical Provider, MD  citalopram (CELEXA) 20 MG tablet Take 20 mg by mouth daily. 01/18/17  Yes Historical Provider, MD  colestipol (COLESTID) 1 g tablet Take 1 g by mouth 2 (two) times daily. 01/18/17  Yes Historical Provider, MD  CRANBERRY PO Take 1 tablet by mouth daily.   Yes Historical Provider, MD  cyanocobalamin (,VITAMIN B-12,) 1000 MCG/ML injection Inject 1,000 mcg into the muscle every 30 (thirty) days.  01/18/17  Yes Historical Provider, MD  lisinopril (PRINIVIL,ZESTRIL) 10 MG tablet Take 10 mg by mouth every evening. 01/18/17  Yes Historical Provider, MD  loratadine (CLARITIN) 10 MG tablet Take 10 mg by mouth daily.   Yes Historical Provider, MD  meloxicam (MOBIC) 15 MG tablet Take 15 mg by mouth daily. 01/03/17  Yes Historical Provider, MD  metFORMIN (GLUCOPHAGE) 850 MG tablet Take 850 mg by mouth 2 (two) times daily. 01/18/17  Yes  Historical Provider, MD  Multiple Vitamin (MULTIVITAMIN WITH MINERALS) TABS tablet Take 1 tablet by mouth daily.   Yes Historical Provider, MD  pioglitazone (ACTOS) 30 MG tablet Take 30 mg by mouth 2 (two) times daily. 01/18/17  Yes Historical Provider, MD  Vitamin D, Ergocalciferol, (DRISDOL) 50000 units CAPS capsule Take 50,000 Units by mouth 2 (two) times a week. On Monday and Thursday 01/18/17  Yes Historical Provider, MD    Physical Exam: Vitals:   01/22/17 2330 01/23/17 0000 01/23/17 0030 01/23/17 0120  BP: 151/72 147/75 154/83 (!) 153/98  Pulse: 82 77 76 74  Resp: 17 17 18    Temp:    98.2 F (36.8 C)  TempSrc:    Oral    SpO2: 94% 94% 94% 96%  Weight:    196 lb 14.4 oz (89.3 kg)  Height:    5\' 3"  (1.6 m)    Constitutional: NAD, calm, comfortable Vitals:   01/22/17 2330 01/23/17 0000 01/23/17 0030 01/23/17 0120  BP: 151/72 147/75 154/83 (!) 153/98  Pulse: 82 77 76 74  Resp: 17 17 18    Temp:    98.2 F (36.8 C)  TempSrc:    Oral  SpO2: 94% 94% 94% 96%  Weight:    196 lb 14.4 oz (89.3 kg)  Height:    5\' 3"  (1.6 m)   Eyes: PERRL, lids and conjunctivae normal, wearing glasses, EOMI ENMT: Mucous membranes are moist. Posterior pharynx clear of any exudate or lesions. Neck: normal, supple, no masses Respiratory: clear to auscultation bilaterally, no wheezing, no crackles. Normal respiratory effort.  Cardiovascular: Normal rate and regular rhythm, no murmurs / rubs / gallops. No extremity edema.  Abdomen: no tenderness, no masses palpated.  Bowel sounds positive.  Musculoskeletal: no clubbing / cyanosis. No joint deformity upper and lower extremities. Normal muscle tone Skin: no rashes, lesions, ulcers.  Neurologic: CN 2-12 grossly intact. Sensation intact to light touch.  Strength 5/5 in all 4.  Psychiatric: Normal judgment and insight. Alert and oriented x 3. Normal mood.    Labs on Admission: I have personally reviewed following labs and imaging studies  CBC:  Recent Labs Lab 01/22/17 2116 01/22/17 2131  WBC 5.3  --   NEUTROABS 3.2  --   HGB 12.4 12.9  HCT 38.1 38.0  MCV 89.6  --   PLT 239  --    Basic Metabolic Panel:  Recent Labs Lab 01/22/17 2116 01/22/17 2131  NA 136 138  K 4.5 4.2  CL 100* 99*  CO2 26  --   GLUCOSE 137* 135*  BUN 25* 29*  CREATININE 1.13* 1.20*  CALCIUM 9.2  --    GFR: Estimated Creatinine Clearance: 48.3 mL/min (by C-G formula based on SCr of 1.2 mg/dL (H)). Liver Function Tests:  Recent Labs Lab 01/22/17 2116  AST 33  ALT 19  ALKPHOS 56  BILITOT 0.7  PROT 6.8  ALBUMIN 3.5   No results for input(s): LIPASE, AMYLASE in the last 168 hours. No  results for input(s): AMMONIA in the last 168 hours. Coagulation Profile:  Recent Labs Lab 01/22/17 2116  INR 0.91   Cardiac Enzymes: No results for input(s): CKTOTAL, CKMB, CKMBINDEX, TROPONINI in the last 168 hours. BNP (last 3 results) No results for input(s): PROBNP in the last 8760 hours. HbA1C: No results for input(s): HGBA1C in the last 72 hours. CBG:  Recent Labs Lab 01/22/17 2119  GLUCAP 128*    Radiological Exams on Admission: Ct Head Code Stroke W/o Cm  Result Date: 01/22/2017 CLINICAL DATA:  Code stroke. Slurred speech. Right-sided headache. Right hand numbness. EXAM: CT HEAD WITHOUT CONTRAST TECHNIQUE: Contiguous axial images were obtained from the base of the skull through the vertex without intravenous contrast. COMPARISON:  None. FINDINGS: Brain: Moderate atrophy is present. Diffuse white matter disease is asymmetric on the left. A lacunar infarct involving the anterior limb of the left internal capsule appears remote. The basal ganglia are otherwise intact. The insular ribbon is intact bilaterally. The brainstem and cerebellum are normal. No acute hemorrhage or mass lesion is present. The ventricles are proportionate to the degree of atrophy. There is ex vacuo dilation on the left associated with the remote lacunar infarct. No significant extra-axial fluid collection is present. Vascular: Calcifications are present within the cavernous internal carotid arteries. There is no hyperdense vessel. Calcifications are also present at dural margin of the vertebral arteries. Skull: The calvarium is intact. No focal lytic or blastic lesions are present. Sinuses/Orbits: Paranasal sinuses and mastoid air cells are clear. The globes and orbits are within normal limits. ASPECTS [facility] Stroke Program Early CT Score) - Ganglionic level infarction (caudate, lentiform nuclei, internal capsule, insula, M1-M3 cortex): 7/7 - Supraganglionic infarction (M4-M6 cortex): 3/3 Total score (0-10 with  10 being normal): 10/10 IMPRESSION: 1. Remote infarct involving the anterior limb of the left internal capsule with associated ex vacuo dilation of the left lateral ventricle. 2. Moderate atrophy and white matter disease. 3. No acute intracranial abnormality. 4. ASPECTS is 10/10 These results were called by telephone at the time of interpretation on 01/22/2017 at 9:19 pm to Dr. Cheral Marker , who verbally acknowledged these results. Electronically Signed   By: San Morelle M.D.   On: 01/22/2017 21:19    EKG: Independently reviewed. NSR with PAC.  No ST or TW changes noted.   Assessment/Plan TIA (transient ischemic attack) - Symptoms resolved, but she has risk factors including HTN and DM2 - MRI/MRA - Neurology following - Telemetry - Check A1C and lipid profile - Allow permissive HTN X 24 hours, ace-I held - Continue aspirin - Neuro checks - She is intolerant to statins, on colestid - PT/OT/slp consults - TTE and carotid dopplers   Hypertension - Allowing permissive HTN currently - Holding lisinopril    Hypercholesteremia - She is intolerant to statins, if true stroke, will need to discuss which statins she has been on before and perhaps attempt a more hydrophilic statin - Check lipid panel    Diabetes mellitus without complication  - Check C5E, continue metformin, holding actos as she may be NPO until speech clears her - SSI  CKD vs. AKI - Unclear baseline, repeat in the AM - If she is cleared to eat, start diet.   DVT prophylaxis: Lovenox  Code Status: Full  Family Communication: Daughter Webb Silversmith in room  Disposition Plan: TIA work up, likely d/c in 23 hours Consults called: Neurology, [place]  Admission status: Obs, telemetry    Gilles Chiquito MD [facility] Pager [phone]  If 7PM-7AM, please contact night-coverage [email or web address] Password [email or web address]  01/23/2017, 2:06 AM

## 2017-01-23 ENCOUNTER — Observation Stay (HOSPITAL_COMMUNITY): Payer: PPO

## 2017-01-23 ENCOUNTER — Encounter (HOSPITAL_COMMUNITY): Payer: Self-pay | Admitting: *Deleted

## 2017-01-23 ENCOUNTER — Observation Stay (HOSPITAL_BASED_OUTPATIENT_CLINIC_OR_DEPARTMENT_OTHER): Payer: PPO

## 2017-01-23 DIAGNOSIS — E119 Type 2 diabetes mellitus without complications: Secondary | ICD-10-CM | POA: Diagnosis not present

## 2017-01-23 DIAGNOSIS — E7800 Pure hypercholesterolemia, unspecified: Secondary | ICD-10-CM | POA: Diagnosis present

## 2017-01-23 DIAGNOSIS — I10 Essential (primary) hypertension: Secondary | ICD-10-CM | POA: Diagnosis not present

## 2017-01-23 DIAGNOSIS — G459 Transient cerebral ischemic attack, unspecified: Secondary | ICD-10-CM

## 2017-01-23 DIAGNOSIS — R4789 Other speech disturbances: Secondary | ICD-10-CM | POA: Diagnosis not present

## 2017-01-23 DIAGNOSIS — G43109 Migraine with aura, not intractable, without status migrainosus: Secondary | ICD-10-CM | POA: Diagnosis not present

## 2017-01-23 DIAGNOSIS — R479 Unspecified speech disturbances: Secondary | ICD-10-CM | POA: Diagnosis not present

## 2017-01-23 LAB — CBC
HCT: 34.4 % — ABNORMAL LOW (ref 36.0–46.0)
HEMOGLOBIN: 11.2 g/dL — AB (ref 12.0–15.0)
MCH: 28.9 pg (ref 26.0–34.0)
MCHC: 32.6 g/dL (ref 30.0–36.0)
MCV: 88.7 fL (ref 78.0–100.0)
Platelets: 228 10*3/uL (ref 150–400)
RBC: 3.88 MIL/uL (ref 3.87–5.11)
RDW: 14.3 % (ref 11.5–15.5)
WBC: 6.3 10*3/uL (ref 4.0–10.5)

## 2017-01-23 LAB — VAS US CAROTID
LCCADSYS: -79 cm/s
LCCAPDIAS: 18 cm/s
LEFT ECA DIAS: -7 cm/s
LEFT VERTEBRAL DIAS: -15 cm/s
LICAPDIAS: -25 cm/s
Left CCA dist dias: -23 cm/s
Left CCA prox sys: 99 cm/s
Left ICA dist dias: -13 cm/s
Left ICA dist sys: -59 cm/s
Left ICA prox sys: -74 cm/s
RCCADSYS: -100 cm/s
RCCAPDIAS: -9 cm/s
RCCAPSYS: -60 cm/s
RIGHT ECA DIAS: -9 cm/s
RIGHT VERTEBRAL DIAS: -9 cm/s

## 2017-01-23 LAB — LIPID PANEL
Cholesterol: 207 mg/dL — ABNORMAL HIGH (ref 0–200)
HDL: 62 mg/dL (ref >40)
LDL Cholesterol: 132 mg/dL — ABNORMAL HIGH (ref 0–99)
Total CHOL/HDL Ratio: 3.3 RATIO
Triglycerides: 67 mg/dL (ref <150)
VLDL: 13 mg/dL (ref 0–40)

## 2017-01-23 LAB — GLUCOSE, CAPILLARY
Glucose-Capillary: 115 mg/dL — ABNORMAL HIGH (ref 65–99)
Glucose-Capillary: 97 mg/dL (ref 65–99)

## 2017-01-23 LAB — CREATININE, SERUM
CREATININE: 1.08 mg/dL — AB (ref 0.44–1.00)
GFR calc non Af Amer: 52 mL/min — ABNORMAL LOW (ref >60)

## 2017-01-23 LAB — ECHOCARDIOGRAM COMPLETE
Height: 63 in
Weight: 3150.4 oz

## 2017-01-23 MED ORDER — METFORMIN HCL 850 MG PO TABS
850.0000 mg | ORAL_TABLET | Freq: Two times a day (BID) | ORAL | Status: DC
  Filled 2017-01-23 (×2): qty 1

## 2017-01-23 MED ORDER — ATORVASTATIN CALCIUM 10 MG PO TABS: 20.0000 mg | ORAL_TABLET | Freq: Every day | ORAL | Status: DC

## 2017-01-23 MED ORDER — ACETAMINOPHEN 650 MG RE SUPP: 650.0000 mg | RECTAL | Status: DC | PRN

## 2017-01-23 MED ORDER — ACETAMINOPHEN 325 MG PO TABS
650.0000 mg | ORAL_TABLET | ORAL | Status: DC | PRN
  Administered 2017-01-23: 650 mg via ORAL
  Filled 2017-01-23: qty 2

## 2017-01-23 MED ORDER — ASPIRIN EC 81 MG PO TBEC
81.0000 mg | DELAYED_RELEASE_TABLET | Freq: Every day | ORAL | Status: DC
  Administered 2017-01-23: 81 mg via ORAL
  Filled 2017-01-23: qty 1

## 2017-01-23 MED ORDER — ENOXAPARIN SODIUM 40 MG/0.4ML ~~LOC~~ SOLN
40.0000 mg | SUBCUTANEOUS | Status: DC
  Administered 2017-01-23: 40 mg via SUBCUTANEOUS
  Filled 2017-01-23 (×2): qty 0.4

## 2017-01-23 MED ORDER — SODIUM CHLORIDE 0.9 % IV SOLN
INTRAVENOUS | Status: AC
  Administered 2017-01-23: 02:00:00 via INTRAVENOUS

## 2017-01-23 MED ORDER — PIOGLITAZONE HCL 30 MG PO TABS: 30.0000 mg | ORAL_TABLET | Freq: Two times a day (BID) | ORAL | Status: DC

## 2017-01-23 MED ORDER — LORATADINE 10 MG PO TABS
10.0000 mg | ORAL_TABLET | Freq: Every day | ORAL | Status: DC
  Administered 2017-01-23: 10 mg via ORAL
  Filled 2017-01-23: qty 1

## 2017-01-23 MED ORDER — INSULIN ASPART 100 UNIT/ML ~~LOC~~ SOLN: 0.0000 [IU] | Freq: Every day | SUBCUTANEOUS | Status: DC

## 2017-01-23 MED ORDER — STROKE: EARLY STAGES OF RECOVERY BOOK
Freq: Once | Status: AC
  Administered 2017-01-23: 1
  Filled 2017-01-23: qty 1

## 2017-01-23 MED ORDER — SENNOSIDES-DOCUSATE SODIUM 8.6-50 MG PO TABS: 1.0000 | ORAL_TABLET | Freq: Every evening | ORAL | Status: DC | PRN

## 2017-01-23 MED ORDER — ACETAMINOPHEN 160 MG/5ML PO SOLN: 650.0000 mg | ORAL | Status: DC | PRN

## 2017-01-23 MED ORDER — CITALOPRAM HYDROBROMIDE 20 MG PO TABS
20.0000 mg | ORAL_TABLET | Freq: Every day | ORAL | Status: DC
  Administered 2017-01-23: 20 mg via ORAL
  Filled 2017-01-23: qty 1

## 2017-01-23 MED ORDER — INSULIN ASPART 100 UNIT/ML ~~LOC~~ SOLN: 0.0000 [IU] | Freq: Three times a day (TID) | SUBCUTANEOUS | Status: DC

## 2017-01-23 NOTE — Progress Notes (Signed)
PT Cancellation Note  Patient Details Name: Bonnie Cook MRN: [id number] DOB: 15-Jun-1951   Cancelled Treatment:    Reason Eval/Treat Not Completed: PT screened, no needs identified, will sign off.  Discussed pt case with OT who states that pt is independent with mobility and PT eval is not necessary at this time. If needs change, please reconsult.    Thelma Comp 01/23/2017, 11:24 AM   Rolinda Roan, PT, DPT Acute Rehabilitation Services Pager: [phone]

## 2017-01-23 NOTE — Procedures (Signed)
ELECTROENCEPHALOGRAM REPORT  Date of Study: 01/23/2017  Patient's Name: Bonnie Cook MRN: [id number] Date of Birth: 1950/12/03  Referring Provider: Dr. Rosalin Hawking  Clinical History: This is a 66 year old woman with word finding difficulty and right hand numbness.  Medications: acetaminophen (TYLENOL) tablet 650 mg  aspirin EC tablet 81 mg  atorvastatin (LIPITOR) tablet 20 mg  citalopram (CELEXA) tablet 20 mg  enoxaparin (LOVENOX) injection 40 mg  insulin aspart (novoLOG) injection 0-15 Units  insulin aspart (novoLOG) injection 0-5 Units  loratadine (CLARITIN) tablet 10 mg  metFORMIN (GLUCOPHAGE) tablet 850 mg  senna-docusate (Senokot-S) tablet 1 tablet   Technical Summary: A multichannel digital EEG recording measured by the international 10-20 system with electrodes applied with paste and impedances below 5000 ohms performed in our laboratory with EKG monitoring in an awake and drowsy patient.  Hyperventilation was not performed. Photic stimulation was performed.  The digital EEG was referentially recorded, reformatted, and digitally filtered in a variety of bipolar and referential montages for optimal display.    Description: The patient is awake and drowsy during the recording.  During maximal wakefulness, there is a symmetric, medium voltage 10-11 Hz posterior dominant rhythm that attenuates with eye opening.  There is occasional focal 4-5 Hz theta slowing over the left temporal region. During drowsiness, there is an increase in theta slowing of the background.  Deeper stages of sleep were not seen. Photic stimulation did not elicit any abnormalities.  There were no epileptiform discharges or electrographic seizures seen.    EKG lead was unremarkable.  Impression: This awake and asleep EEG is abnormal due to occasional focal slowing over the left temporal region.  Clinical Correlation of the above findings indicates focal cerebral dysfunction over the left temporal region  suggestive of underlying structural or physiologic abnormality. The absence of epileptiform discharges does not exclude a clinical diagnosis of epilepsy. Clinical correlation is advised.    Ellouise Newer, M.D.

## 2017-01-23 NOTE — Progress Notes (Signed)
**  Preliminary report by tech**  Carotid artery duplex complete. Findings are consistent with a 1-39 percent stenosis involving the right internal carotid artery and the left internal carotid artery. The vertebral arteries demonstrate antegrade flow.  01/23/17 9:51 AM Bonnie Cook RVT

## 2017-01-23 NOTE — Progress Notes (Signed)
  Echocardiogram 2D Echocardiogram has been performed.  Darlina Sicilian M 01/23/2017, 3:02 PM

## 2017-01-23 NOTE — Care Management Note (Signed)
Case Management Note  Patient Details  Name: Bonnie Cook MRN: [id number] Date of Birth: Jan 26, 1951  Subjective/Objective:                    Action/Plan: Pt discharging home with self care. Pt has insurance and PCP. No further needs per CM.    Expected Discharge Date:  01/23/17               Expected Discharge Plan:  Home/Self Care  In-House Referral:     Discharge planning Services     Post Acute Care Choice:    Choice offered to:     DME Arranged:    DME Agency:     HH Arranged:    HH Agency:     Status of Service:  Completed, signed off  If discussed at [employer] of Stay Meetings, dates discussed:    Additional Comments:  Pollie Friar, RN 01/23/2017, 4:34 PM

## 2017-01-23 NOTE — Progress Notes (Addendum)
STROKE TEAM PROGRESS NOTE   HISTORY OF PRESENT ILLNESS (per record) Bonnie Cook is a 66 y.o. female who presents for the evaluation of transient aphasia. She was LKW at [place] 01/22/2017 while speaking with her daughter over the phone. At [place] her daughter noticed the patient's speech was hesitant with "trouble finding words"; unclear if this evolved from normal during their conversation or was sudden. Her daughter rushed over to her house and the speech deficit was still present. There was no dysarthria. The patient also began to experience right hand numbness and headache at about 2015. By the time she arrived at the ED, all of her symptoms had resolved except for her headache. She states that the headache is still present, is bifrontal, nonthrobbing, with pain rated as 2/10. Patient was not administered IV t-PA secondary to symptoms resolved.   CT head was obtained, revealing no acute abnormality. A small remote infarct involving the anterior limb of the left internal capsule is seen. Moderate atrophy and white matter disease are also noted.   EKG shows no atrial fibrillation. She was admitted for further evaluation and treatment.  Her PMHx includes hypercholesterolemia, HTN, DM and asthma. She has no prior history of stroke or MI. She takes ASA daily.     SUBJECTIVE (INTERVAL HISTORY) Daughter is at bedside. Pt is back to baseline. Denies any hx of HA. Yesterday stated to have aphasia for 44min followed with HA for over night. Now all resolved. Never happened before. Denies LOC or confusion during the event. Denies any heart palpitation.    OBJECTIVE Temp:  [98 F (36.7 C)-98.6 F (37 C)] 98.1 F (36.7 C) (03/12 0600) Pulse Rate:  [71-101] 74 (03/12 0600) Cardiac Rhythm: Normal sinus rhythm (03/12 0138) Resp:  [16-18] 16 (03/12 0600) BP: (104-165)/(72-98) 156/76 (03/12 0600) SpO2:  [94 %-100 %] 97 % (03/12 0600) Weight:  [80.9 kg (178 lb 5.6 oz)-89.3 kg (196 lb 14.4 oz)] 89.3 kg (196  lb 14.4 oz) (03/12 0120)  CBC:  Recent Labs Lab 01/22/17 2116 01/22/17 2131 01/23/17 0142  WBC 5.3  --  6.3  NEUTROABS 3.2  --   --   HGB 12.4 12.9 11.2*  HCT 38.1 38.0 34.4*  MCV 89.6  --  88.7  PLT 239  --  696    Basic Metabolic Panel:  Recent Labs Lab 01/22/17 2116 01/22/17 2131 01/23/17 0142  NA 136 138  --   K 4.5 4.2  --   CL 100* 99*  --   CO2 26  --   --   GLUCOSE 137* 135*  --   BUN 25* 29*  --   CREATININE 1.13* 1.20* 1.08*  CALCIUM 9.2  --   --     Lipid Panel:    Component Value Date/Time   CHOL 207 (H) 01/23/2017 0142   TRIG 67 01/23/2017 0142   HDL 62 01/23/2017 0142   CHOLHDL 3.3 01/23/2017 0142   VLDL 13 01/23/2017 0142   LDLCALC 132 (H) 01/23/2017 0142   HgbA1c: No results found for: HGBA1C Urine Drug Screen: No results found for: LABOPIA, COCAINSCRNUR, LABBENZ, AMPHETMU, THCU, LABBARB    IMAGING I have personally reviewed the radiological images below and agree with the radiology interpretations.  Ct Head Code Stroke W/o Cm 01/22/2017 1. Remote infarct involving the anterior limb of the left internal capsule with associated ex vacuo dilation of the left lateral ventricle. 2. Moderate atrophy and white matter disease. 3. No acute intracranial abnormality. 4. ASPECTS is  10/10   Mr Brain Wo Contrast 01/23/2017 1. No acute intracranial process. 2. Remote left basal ganglia lacunar infarct. 3. Mild chronic microvascular ischemic disease.   Mr Jodene Nam Head/brain Wo Cm 01/23/2017 Normal intracranial MRA.   CUS -  Findings are consistent with a 1-39 percent stenosis involving the right internal carotid artery and the left internal carotid artery. The vertebral arteries demonstrate antegrade flow.  EEG -  This awake and asleep EEG is abnormal due to occasional focal slowing over the left temporal region. Clinical Correlation of the above findings indicates focal cerebral dysfunction over the left temporal region suggestive of underlying structural  or physiologic abnormality. The absence of epileptiform discharges does not exclude a clinical diagnosis of epilepsy. Clinical correlation is advised.  TTE - pending   PHYSICAL EXAM  Temp:  [98 F (36.7 C)-98.6 F (37 C)] 98.1 F (36.7 C) (03/12 0600) Pulse Rate:  [71-101] 74 (03/12 0600) Resp:  [16-18] 16 (03/12 0600) BP: (104-165)/(72-98) 156/76 (03/12 0600) SpO2:  [94 %-100 %] 97 % (03/12 0600) Weight:  [178 lb 5.6 oz (80.9 kg)-196 lb 14.4 oz (89.3 kg)] 196 lb 14.4 oz (89.3 kg) (03/12 0120)  General - Well nourished, well developed, in no apparent distress.  Ophthalmologic - Sharp disc margins OU.   Cardiovascular - Regular rate and rhythm.  Mental Status -  Level of arousal and orientation to time, place, and person were intact. Language including expression, naming, repetition, comprehension was assessed and found intact. Fund of Knowledge was assessed and was intact.  Cranial Nerves II - XII - II - Visual field intact OU. III, IV, VI - Extraocular movements intact. V - Facial sensation intact bilaterally. VII - Facial movement intact bilaterally. VIII - Hearing & vestibular intact bilaterally. X - Palate elevates symmetrically. XI - Chin turning & shoulder shrug intact bilaterally. XII - Tongue protrusion intact.  Motor Strength - The patient's strength was normal in all extremities and pronator drift was absent.  Bulk was normal and fasciculations were absent.   Motor Tone - Muscle tone was assessed at the neck and appendages and was normal.  Reflexes - The patient's reflexes were 1+ in all extremities and she had no pathological reflexes.  Sensory - Light touch, temperature/pinprick were assessed and were symmetrical.    Coordination - The patient had normal movements in the hands and feet with no ataxia or dysmetria.  Tremor was absent.  Gait and Station - deferred.   ASSESSMENT/PLAN Bonnie Cook is a 66 y.o. female with history of HTN, DM, HLD and  asthma presenting with expressive aphasia followed by HA. She did not receive IV t-PA due to symptoms resolved.   Complicated HA vs. Seizure. Less likely TIA - pt denies migraine history, no LOC or confusion during event.  Code Stroke CT no acute abnormality. old L internal capsule infarct.   MRI  No acute stroke. Old L BG lacune. Microvascular dz  MRA  Unremarkable   Carotid Doppler unremarkable  2D Echo pending  LDL 132  HgbA1c pending  Lovenox 40 mg sq daily for VTE prophylaxis  Diet heart healthy/carb modified Room service appropriate? Yes; Fluid consistency: Thin  aspirin 81 mg daily prior to admission, now on aspirin 81 mg daily. Continue ASA on discharge.  Patient counseled to be compliant with her antithrombotic medications  Ongoing aggressive stroke risk factor management  Therapy recommendations:  pending   Disposition:  pending   Abnormal EEG  EEG showed left temporal slowing, but  no seizure  Fit to pt symptoms  Seizure can not be excluded  Consider to repeat EEG as outpt  However, do not feel need AED this time  Close monitoring at home  If symptom recurs or EEG repeat abnormal, may consider AED  Complicated HA  Transient aphasia followed by HA  No hx of migraine  Infrequent  No need prophylaxis meds  Close observation  Likely OSA  Daughter noticed desat overnight during sleep  Will refer to outpt sleep study.  Hypertension  Stable  Long-term BP goal normotensive  Hyperlipidemia  Home meds:  No statin  LDL 132, goal < 70  Multiple statin intolerance history  Recommend PCP follow up to consider PCSK9 inhibitors  Diabetes  HgbA1c pending, goal < 7.0  Controlled  SSI  CBG monitoring  Other Stroke Risk Factors  Advanced age  Obesity, Body mass index is 34.88 kg/m., recommend weight loss, diet and exercise as appropriate   Other Active Problems  CKD vs AKI    Hospital day # 0  Neurology will sign off.  Please call with questions. Pt will follow up with Dr. Erlinda Hong at [facility] in about 6 weeks. Thanks for the consult.  Rosalin Hawking, MD PhD Stroke Neurology 01/23/2017 3:33 PM   To contact Stroke Continuity provider, please refer to [email or web address] After hours, contact General Neurology

## 2017-01-23 NOTE — Progress Notes (Signed)
Patient discharged home with family. Discharge instructions given. IV and telemetry discontinued. Patient will transport home with family by car. Patient questions asked and answered. Bonnie Cook

## 2017-01-23 NOTE — Evaluation (Signed)
Occupational Therapy Evaluation Patient Details Name: Bonnie Cook MRN: [id number] DOB: 08/20/1951 Today's Date: 01/23/2017    History of Present Illness Pt is a 66 y.o. female who presented to the ED with word finding difficulty and R hand numbness with headache. 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 She has a PMH significant for asthma, diabetes mellitus without complication, hypercholosteremia, and hypertension.   Clinical Impression   PTA, pt was independent with ADL and functional mobility and working full time. Pt currently able to complete all ADL independently in hospital setting. Sensation deficits in R UE have resolved per pt report and upon testing no deficits noted. Educated pt and daughter on stroke warning signs and safety post-acute D/C. No further acute OT needs identified and no OT follow-up recommended. OT will sign off.     Follow Up Recommendations  No OT follow up    Equipment Recommendations  None recommended by OT    Recommendations for Other Services       Precautions / Restrictions Precautions Precautions: None Restrictions Weight Bearing Restrictions: No      Mobility Bed Mobility Overal bed mobility: Independent                Transfers Overall transfer level: Independent                    Balance Overall balance assessment: No apparent balance deficits (not formally assessed)                                          ADL Overall ADL's : Independent                                       General ADL Comments: Able to complete ADL tasks independently during evaluation.     Vision Baseline Vision/History: Wears glasses Wears Glasses: At all times Patient Visual Report: No change from baseline Vision Assessment?: No apparent visual deficits Additional Comments: Screened and pt able to use central and peripheral vision without difficutly.  Able to read menu.     Perception     Praxis Praxis Praxis tested?: Within functional limits    Pertinent Vitals/Pain Pain Assessment: No/denies pain     Hand Dominance Right   Extremity/Trunk Assessment Upper Extremity Assessment Upper Extremity Assessment: LUE deficits/detail LUE Deficits / Details: Slightly decreased L shoulder strength as compared to R (4+/5). Pt reports this is baseline due to arthritis.   Lower Extremity Assessment Lower Extremity Assessment: Overall WFL for tasks assessed       Communication Communication Communication: No difficulties   Cognition Arousal/Alertness: Awake/alert Behavior During Therapy: WFL for tasks assessed/performed Overall Cognitive Status: Within Functional Limits for tasks assessed                 General Comments: Demonstrated ability to complete problem solving tasks during ADL.   General Comments       Exercises       Shoulder Instructions      Home Living Family/patient expects to be discharged to:: Private residence Living Arrangements: Alone Available Help at Discharge: Family;Available PRN/intermittently Type of Home: House             Bathroom Shower/Tub: Teacher, early years/pre: Standard  Home Equipment: None          Prior Functioning/Environment Level of Independence: Independent        Comments: Works full time and planning to retire in May.        OT Problem List: Decreased strength;Decreased safety awareness;Decreased knowledge of precautions      OT Treatment/Interventions:      OT Goals(Current goals can be found in the care plan section) Acute Rehab OT Goals Patient Stated Goal: get some rest OT Goal Formulation: With patient/family Time For Goal Achievement: 01/30/17 Potential to Achieve Goals: Good  OT Frequency:     Barriers to D/C:            Co-evaluation              End of Session Nurse Communication: Mobility status;Other (comment)  (IV beeping)  Activity Tolerance: Patient tolerated treatment well Patient left: in bed;with call bell/phone within reach;with family/visitor present  OT Visit Diagnosis: Unsteadiness on feet (R26.81)                ADL either performed or assessed with clinical judgement  Time: 2330-0762 OT Time Calculation (min): 16 min Charges:  OT General Charges $OT Visit: 1 Procedure OT Evaluation $OT Eval Low Complexity: 1 Procedure G-Codes: OT G-codes **NOT FOR INPATIENT CLASS** Functional Assessment Tool Used: AM-PAC 6 Clicks Daily Activity Functional Limitation: Self care Self Care Current Status (U6333): 0 percent impaired, limited or restricted Self Care Goal Status (L4562): 0 percent impaired, limited or restricted Self Care Discharge Status (B6389): 0 percent impaired, limited or restricted   Norman Herrlich, MS OTR/L  Pager: [place] Bonnie Cook 01/23/2017, 9:42 AM

## 2017-01-23 NOTE — Progress Notes (Signed)
Pt at MRI will resume Neuro checks  Upon return.

## 2017-01-23 NOTE — Progress Notes (Signed)
EEG completed; results pending.    

## 2017-01-23 NOTE — Care Management Note (Signed)
Case Management Note  Patient Details  Name: Bonnie Cook MRN: [id number] Date of Birth: 1951/07/03  Subjective/Objective:                  Patient presented with word-finding difficulty. Lives at home alone. CM will follow for discharge needs pending therapy evals and physician orders.   Action/Plan:   Expected Discharge Date:                  Expected Discharge Plan:     In-House Referral:     Discharge planning Services     Post Acute Care Choice:    Choice offered to:     DME Arranged:    DME Agency:     HH Arranged:    HH Agency:     Status of Service:     If discussed at [employer] of Stay Meetings, dates discussed:    Additional Comments:  Rolm Baptise, RN 01/23/2017, 10:38 AM

## 2017-01-23 NOTE — Progress Notes (Signed)
Patient admited from E.R via stretcher. Alert and orientedx4 oriented to unit and room . Tele connected and verified. Daughter at bedside.

## 2017-01-23 NOTE — Progress Notes (Signed)
Patient back from MRI.

## 2017-01-23 NOTE — Care Management Obs Status (Signed)
[place] NOTIFICATION   Patient Details  Name: Bonnie Cook MRN: [id number] Date of Birth: 1951-07-10   Medicare Observation Status Notification Given:  Yes    Pollie Friar, RN 01/23/2017, 3:35 PM

## 2017-01-23 NOTE — Discharge Summary (Signed)
Physician Discharge Summary  Bonnie Cook HEN:[id number] DOB: 1951-06-11 DOA: 01/22/2017  PCP: Myrlene Broker, MD  Admit date: 01/22/2017 Discharge date: 01/23/2017   Recommendations for Outpatient Follow-Up:   Echo results pending HgbA1C pending PCP follow up to consider PCSK9 inhibitors   Discharge Diagnosis:   Active Problems:   TIA (transient ischemic attack)   Hypertension   Hypercholesteremia   Diabetes mellitus without complication ([place])   Word finding difficulty   Discharge disposition:  Home.  Discharge Condition: Improved.  Diet recommendation: Low sodium, heart healthy.  Carbohydrate-modified  Wound care: None.   History of Present Illness:   Bonnie Cook is a 66 y.o. female with medical history significant of HTN, HLD, DM2, asthma who presents with word finding difficulty.  Patient reports that this afternoon while talking on the phone with her daughter she developed nonsense speech and word finding difficulty.  The patient reports that she felt like she was saying the right words, but she noticed that her daughter became upset with the way she was talking.  Her daughter went to [name]'s home and at that time she developed right arm numbness feeling like it was asleep.  She denies weakness, facial droop.  The symptoms had resolved by the time she arrived at the ED. She reports taking all of her medication as prescribed. She takes her BP medication at night.  She has never had anything like this before.  She has a family history of TIA in her mother and stroke in her grandmother.  She takes a daily aspirin.  Other symptoms include headache behind her eyes, but no vision changes or blurry vision.  Denies nausea, vomiting, dysuria.    Hospital Course by Problem:   Complicated HA vs. Seizure. Less likely TIA - pt denies migraine history, no LOC or confusion during event.  Code Stroke CT no acute abnormality. old L internal capsule infarct.   MRI  No acute  stroke. Old L BG lacune. Microvascular dz  MRA  Unremarkable   Carotid Doppler unremarkable  2D Echo pending  LDL 132  HgbA1c pending  aspirin 81 mg daily prior to admission, now on aspirin 81 mg daily. Continue ASA on discharge.   Abnormal EEG  EEG showed left temporal slowing, but no seizure  Seizure can not be excluded  Consider to repeat EEG as outpt  Neuro does not feel need AED this time  If symptom recurs or EEG repeat abnormal, may consider AED  Complicated HA  Transient aphasia followed by HA  No hx of migraine  Infrequent  No need prophylaxis meds  Likely OSA  Daughter noticed desat overnight during sleep  Will refer to outpt sleep study.  Hypertension  Stable   Long-term BP goal normotensive  Hyperlipidemia  Home meds:  No statin  LDL 132, goal < 70  Multiple statin intolerance history  Recommend PCP follow up to consider PCSK9 inhibitors  Diabetes  HgbA1c pending, goal < 7.0       Medical Consultants:    Neuro   Discharge Exam:   Vitals:   01/23/17 0433 01/23/17 0600  BP: (!) 153/73 (!) 156/76  Pulse: 71 74  Resp: 16 16  Temp: 98.2 F (36.8 C) 98.1 F (36.7 C)   Vitals:   01/23/17 0120 01/23/17 0200 01/23/17 0433 01/23/17 0600  BP: (!) 153/98 (!) 150/75 (!) 153/73 (!) 156/76  Pulse: 74 79 71 74  Resp: 16 16 16 16   Temp: 98.2 F (36.8 C) 98  F (36.7 C) 98.2 F (36.8 C) 98.1 F (36.7 C)  TempSrc: Oral Oral Oral Oral  SpO2: 96% 98% 96% 97%  Weight: 89.3 kg (196 lb 14.4 oz)     Height: 5\' 3"  (1.6 m)       Gen:  NAD    The results of significant diagnostics from this hospitalization (including imaging, microbiology, ancillary and laboratory) are listed below for reference.     Procedures and Diagnostic Studies:   Dg Chest 2 View  Result Date: 01/23/2017 CLINICAL DATA:  TIA symptoms EXAM: CHEST  2 VIEW COMPARISON:  None available FINDINGS: Normal heart size and vascularity. Chronic bronchitic  changes and mild interstitial prominence bilaterally. No definite focal pneumonia, collapse or consolidation. Negative for edema, effusion or pneumothorax. Trachea is midline. Degenerative changes of the spine. IMPRESSION: Chronic bronchitic changes and mild scattered reticulonodular interstitial opacities, suspect a component of chronic interstitial lung disease. No superimposed acute process. Electronically Signed   By: Jerilynn Mages.  Shick M.D.   On: 01/23/2017 08:39   Mr Brain Wo Contrast  Result Date: 01/23/2017 CLINICAL DATA:  Initial evaluation for speech difficulty and headache. EXAM: MRI HEAD WITHOUT CONTRAST MRA HEAD WITHOUT CONTRAST TECHNIQUE: Multiplanar, multiecho pulse sequences of the brain and surrounding structures were obtained without intravenous contrast. Angiographic images of the head were obtained using MRA technique without contrast. COMPARISON:  Prior CT from 01/22/2017. FINDINGS: MRI HEAD FINDINGS Brain: Cerebral volume normal. Patchy T2/FLAIR hyperintensity within the periventricular white matter, most consistent with chronic microvascular ischemic disease, mild in nature. Chronic microvascular changes present within the pons. Remote lacunar infarct within the left basal ganglia. Small amount of associated chronic blood products. No abnormal foci of restricted diffusion to suggest acute infarct. Gray-white matter differentiation maintained. No evidence for acute intracranial hemorrhage. No mass lesion, midline shift or mass effect. No hydrocephalus. No extra-axial fluid collection. Major dural sinuses are grossly patent. Pituitary and suprasellar region within normal limits. Vascular: Major intracranial vascular flow voids are maintained. Skull and upper cervical spine: Craniocervical junction within normal limits. Visualized upper cervical spine unremarkable. Bone marrow signal intensity normal. Scalp soft tissues unremarkable. Sinuses/Orbits: Globes and orbital soft tissues within normal  limits. Paranasal sinuses are clear. No mastoid effusion. Other: None. MRA HEAD FINDINGS ANTERIOR CIRCULATION: The study degraded by motion artifact. Distal cervical segments of the internal carotid arteries are patent with antegrade flow. Petrous, cavernous, supraclinoid segments patent without flow-limiting stenosis. A1 segments patent. Anterior communicating artery normal. Anterior cerebral arteries patent to their distal aspects. M1 segments patent without stenosis or occlusion. Distal MCA branches well opacified and symmetric. POSTERIOR CIRCULATION: Vertebral arteries patent to the vertebrobasilar junction. Basilar artery widely patent. Superior cerebellar and posterior cerebral arteries well opacified bilaterally. No aneurysm or vascular malformation. IMPRESSION: MRI HEAD IMPRESSION: 1. No acute intracranial process. 2. Remote left basal ganglia lacunar infarct. 3. Mild chronic microvascular ischemic disease. MRA HEAD IMPRESSION: Normal intracranial MRA. Electronically Signed   By: Jeannine Boga M.D.   On: 01/23/2017 05:09   Mr Jodene Nam Head/brain IE Cm  Result Date: 01/23/2017 CLINICAL DATA:  Initial evaluation for speech difficulty and headache. EXAM: MRI HEAD WITHOUT CONTRAST MRA HEAD WITHOUT CONTRAST TECHNIQUE: Multiplanar, multiecho pulse sequences of the brain and surrounding structures were obtained without intravenous contrast. Angiographic images of the head were obtained using MRA technique without contrast. COMPARISON:  Prior CT from 01/22/2017. FINDINGS: MRI HEAD FINDINGS Brain: Cerebral volume normal. Patchy T2/FLAIR hyperintensity within the periventricular white matter, most consistent with chronic microvascular ischemic  disease, mild in nature. Chronic microvascular changes present within the pons. Remote lacunar infarct within the left basal ganglia. Small amount of associated chronic blood products. No abnormal foci of restricted diffusion to suggest acute infarct. Gray-white matter  differentiation maintained. No evidence for acute intracranial hemorrhage. No mass lesion, midline shift or mass effect. No hydrocephalus. No extra-axial fluid collection. Major dural sinuses are grossly patent. Pituitary and suprasellar region within normal limits. Vascular: Major intracranial vascular flow voids are maintained. Skull and upper cervical spine: Craniocervical junction within normal limits. Visualized upper cervical spine unremarkable. Bone marrow signal intensity normal. Scalp soft tissues unremarkable. Sinuses/Orbits: Globes and orbital soft tissues within normal limits. Paranasal sinuses are clear. No mastoid effusion. Other: None. MRA HEAD FINDINGS ANTERIOR CIRCULATION: The study degraded by motion artifact. Distal cervical segments of the internal carotid arteries are patent with antegrade flow. Petrous, cavernous, supraclinoid segments patent without flow-limiting stenosis. A1 segments patent. Anterior communicating artery normal. Anterior cerebral arteries patent to their distal aspects. M1 segments patent without stenosis or occlusion. Distal MCA branches well opacified and symmetric. POSTERIOR CIRCULATION: Vertebral arteries patent to the vertebrobasilar junction. Basilar artery widely patent. Superior cerebellar and posterior cerebral arteries well opacified bilaterally. No aneurysm or vascular malformation. IMPRESSION: MRI HEAD IMPRESSION: 1. No acute intracranial process. 2. Remote left basal ganglia lacunar infarct. 3. Mild chronic microvascular ischemic disease. MRA HEAD IMPRESSION: Normal intracranial MRA. Electronically Signed   By: Jeannine Boga M.D.   On: 01/23/2017 05:09   Ct Head Code Stroke W/o Cm  Result Date: 01/22/2017 CLINICAL DATA:  Code stroke. Slurred speech. Right-sided headache. Right hand numbness. EXAM: CT HEAD WITHOUT CONTRAST TECHNIQUE: Contiguous axial images were obtained from the base of the skull through the vertex without intravenous contrast.  COMPARISON:  None. FINDINGS: Brain: Moderate atrophy is present. Diffuse white matter disease is asymmetric on the left. A lacunar infarct involving the anterior limb of the left internal capsule appears remote. The basal ganglia are otherwise intact. The insular ribbon is intact bilaterally. The brainstem and cerebellum are normal. No acute hemorrhage or mass lesion is present. The ventricles are proportionate to the degree of atrophy. There is ex vacuo dilation on the left associated with the remote lacunar infarct. No significant extra-axial fluid collection is present. Vascular: Calcifications are present within the cavernous internal carotid arteries. There is no hyperdense vessel. Calcifications are also present at dural margin of the vertebral arteries. Skull: The calvarium is intact. No focal lytic or blastic lesions are present. Sinuses/Orbits: Paranasal sinuses and mastoid air cells are clear. The globes and orbits are within normal limits. ASPECTS [facility] Stroke Program Early CT Score) - Ganglionic level infarction (caudate, lentiform nuclei, internal capsule, insula, M1-M3 cortex): 7/7 - Supraganglionic infarction (M4-M6 cortex): 3/3 Total score (0-10 with 10 being normal): 10/10 IMPRESSION: 1. Remote infarct involving the anterior limb of the left internal capsule with associated ex vacuo dilation of the left lateral ventricle. 2. Moderate atrophy and white matter disease. 3. No acute intracranial abnormality. 4. ASPECTS is 10/10 These results were called by telephone at the time of interpretation on 01/22/2017 at 9:19 pm to Dr. Cheral Marker , who verbally acknowledged these results. Electronically Signed   By: San Morelle M.D.   On: 01/22/2017 21:19     Labs:   Basic Metabolic Panel:  Recent Labs Lab 01/22/17 2116 01/22/17 2131 01/23/17 0142  NA 136 138  --   K 4.5 4.2  --   CL 100* 99*  --  CO2 26  --   --   GLUCOSE 137* 135*  --   BUN 25* 29*  --   CREATININE 1.13* 1.20* 1.08*   CALCIUM 9.2  --   --    GFR Estimated Creatinine Clearance: 53.6 mL/min (by C-G formula based on SCr of 1.08 mg/dL (H)). Liver Function Tests:  Recent Labs Lab 01/22/17 2116  AST 33  ALT 19  ALKPHOS 56  BILITOT 0.7  PROT 6.8  ALBUMIN 3.5   No results for input(s): LIPASE, AMYLASE in the last 168 hours. No results for input(s): AMMONIA in the last 168 hours. Coagulation profile  Recent Labs Lab 01/22/17 2116  INR 0.91    CBC:  Recent Labs Lab 01/22/17 2116 01/22/17 2131 01/23/17 0142  WBC 5.3  --  6.3  NEUTROABS 3.2  --   --   HGB 12.4 12.9 11.2*  HCT 38.1 38.0 34.4*  MCV 89.6  --  88.7  PLT 239  --  228   Cardiac Enzymes: No results for input(s): CKTOTAL, CKMB, CKMBINDEX, TROPONINI in the last 168 hours. BNP: Invalid input(s): POCBNP CBG:  Recent Labs Lab 01/22/17 2119 01/23/17 0611 01/23/17 1132  GLUCAP 128* 115* 97   D-Dimer No results for input(s): DDIMER in the last 72 hours. Hgb A1c No results for input(s): HGBA1C in the last 72 hours. Lipid Profile  Recent Labs  01/23/17 0142  CHOL 207*  HDL 62  LDLCALC 132*  TRIG 67  CHOLHDL 3.3   Thyroid function studies No results for input(s): TSH, T4TOTAL, T3FREE, THYROIDAB in the last 72 hours.  Invalid input(s): FREET3 Anemia work up No results for input(s): VITAMINB12, FOLATE, FERRITIN, TIBC, IRON, RETICCTPCT in the last 72 hours. Microbiology No results found for this or any previous visit (from the past 240 hour(s)).   Discharge Instructions:   Discharge Instructions    Ambulatory referral to Neurology    Complete by:  As directed    Pt will follow up with Dr. Erlinda Hong at [facility] in about 2-3 months. Thanks.   Ambulatory referral to Sleep Studies    Complete by:  As directed    Diet - low sodium heart healthy    Complete by:  As directed    Diet Carb Modified    Complete by:  As directed    Discharge instructions    Complete by:  As directed    PCP For PCSK9 inhibitors (for  cholesterol) Repeat outpatient EEG   Increase activity slowly    Complete by:  As directed      Allergies as of 01/23/2017      Reactions   Penicillins Other (See Comments)   As a child, doesn't remember reaction.       Medication List    TAKE these medications   aspirin EC 81 MG tablet Take 81 mg by mouth daily.   CALCIUM PO Take 2 tablets by mouth daily.   citalopram 20 MG tablet Commonly known as:  CELEXA Take 20 mg by mouth daily.   colestipol 1 g tablet Commonly known as:  COLESTID Take 1 g by mouth 2 (two) times daily.   CRANBERRY PO Take 1 tablet by mouth daily.   cyanocobalamin 1000 MCG/ML injection Commonly known as:  (VITAMIN B-12) Inject 1,000 mcg into the muscle every 30 (thirty) days.   lisinopril 10 MG tablet Commonly known as:  PRINIVIL,ZESTRIL Take 10 mg by mouth every evening.   loratadine 10 MG tablet Commonly known as:  CLARITIN Take  10 mg by mouth daily.   meloxicam 15 MG tablet Commonly known as:  MOBIC Take 15 mg by mouth daily.   metFORMIN 850 MG tablet Commonly known as:  GLUCOPHAGE Take 850 mg by mouth 2 (two) times daily.   multivitamin with minerals Tabs tablet Take 1 tablet by mouth daily.   pioglitazone 30 MG tablet Commonly known as:  ACTOS Take 30 mg by mouth 2 (two) times daily.   Vitamin D (Ergocalciferol) 50000 units Caps capsule Commonly known as:  DRISDOL Take 50,000 Units by mouth 2 (two) times a week. On Monday and Thursday      Follow-up Information    Xu,Jindong, MD. Schedule an appointment as soon as possible for a visit in 8 week(s).   Specialty:  Neurology Contact information: [address] [place] [address] [phone]        ROBBINS,ROBERT A, MD Follow up in 1 week(s).   Specialty:  Family Medicine Why:  HgbA1C pending, echo read pending Contact information: [place] [address]            Time coordinating discharge: 35 min  Signed:  Abyan Cadman  U Charisma Charlot   [facility] 01/23/2017, 3:46 PM

## 2017-01-24 LAB — HEMOGLOBIN A1C
Hgb A1c MFr Bld: 5.6 % (ref 4.8–5.6)
Mean Plasma Glucose: 114 mg/dL

## 2017-01-26 ENCOUNTER — Ambulatory Visit (INDEPENDENT_AMBULATORY_CARE_PROVIDER_SITE_OTHER): Payer: PPO | Admitting: Neurology

## 2017-01-26 ENCOUNTER — Encounter: Payer: Self-pay | Admitting: Neurology

## 2017-01-26 VITALS — BP 148/80 | HR 74 | Resp 16 | Ht 63.0 in | Wt 176.0 lb

## 2017-01-26 DIAGNOSIS — G43109 Migraine with aura, not intractable, without status migrainosus: Secondary | ICD-10-CM

## 2017-01-26 DIAGNOSIS — G2581 Restless legs syndrome: Secondary | ICD-10-CM

## 2017-01-26 DIAGNOSIS — R0683 Snoring: Secondary | ICD-10-CM | POA: Diagnosis not present

## 2017-01-26 DIAGNOSIS — R400 Somnolence: Secondary | ICD-10-CM | POA: Diagnosis not present

## 2017-01-26 DIAGNOSIS — I6381 Other cerebral infarction due to occlusion or stenosis of small artery: Secondary | ICD-10-CM

## 2017-01-26 DIAGNOSIS — G4734 Idiopathic sleep related nonobstructive alveolar hypoventilation: Secondary | ICD-10-CM

## 2017-01-26 DIAGNOSIS — I639 Cerebral infarction, unspecified: Secondary | ICD-10-CM

## 2017-01-26 NOTE — Patient Instructions (Signed)

## 2017-01-26 NOTE — Progress Notes (Signed)
Subjective:    Patient ID: Bonnie Cook is a 66 y.o. female.  HPI     Bonnie Age, MD, PhD [facility] [address]O. [place], [place] [address]  Dear Bonnie Cook,   I saw your patient, Bonnie Cook, upon your kind request in my clinic today for initial consultation of her sleep disorder, in particular, concern for underlying obstructive sleep apnea. The patient is unaccompanied today. As you know, Bonnie Cook is a 66 year old right-handed woman with an underlying medical history of hypertension, hyperlipidemia, diabetes, asthma, prior left basal ganglia lacunar infarct, recent hospitalization in from 01/22/2017 through 01/23/2017 for suspected TIA and transient word finding difficulties, who reports snoring and excessive daytime somnolence as well as desaturations noted during her hospital stay. I reviewed your consultation report from 01/23/2017, I also reviewed hospital records from her recent admission earlier this month. She had stroke workup and also workup for suspected seizure. EEG showed left temporal slowing but no actual seizure, MRI brain showed no acute finding but old left basal ganglia lacunar stroke. She did have microvascular changes that were in the moderate degree. MRA showed no significant intracranial stenosis and normal findings, carotid Doppler study was unremarkable, LDL elevated at 132, A1c 5.6.  She lives alone, divorced, has 2 grown children and works for a [employer]. She is a non-smoker, does not drink alcohol and drinks caffeine in the form of coffee, usually one cup per day. Her Epworth sleepiness score is 12 out of 24, her fatigue score is 22 out of 63. She does not have a history of migraines, feels back to baseline since her hospital discharge. Does have a history of daytime tiredness and feels like she could take a nap in the middle of the day at lunch break. She works part-time about 30 hours per week. She is getting ready to  retire in May. She used to work for an [employer] before this. She has 1 cat in the house and also on her bed at times. She gets up to use the bathroom about 2-3 times per average night, has intermittent restless leg symptoms, worse in the past, less so now. She is not someone that has morning headaches typically. She has no family history of OSA. Bedtime is between 9 and 9:30 PM and wakeup time is around 6:30 AM.  Her Past Medical History Is Significant For: Past Medical History:  Diagnosis Date  . Asthma   . Diabetes mellitus without complication ([place])   . Hypercholesteremia   . Hypertension     Her Past Surgical History Is Significant For: Past Surgical History:  Procedure Laterality Date  . BACK SURGERY    . HAND SURGERY  2014    Her Family History Is Significant For: Family History  Problem Relation Cook of Onset  . Transient ischemic attack Mother   . Alzheimer's disease Mother   . Cancer Mother   . Heart disease Father   . Stroke Maternal Grandmother     Her Social History Is Significant For: Social History   Social History  . Marital status: Legally Separated    Spouse name: N/A  . Number of children: 2  . Years of education: 60   Social History Main Topics  . Smoking status: Never Smoker  . Smokeless tobacco: Never Used  . Alcohol use No  . Drug use: No  . Sexual activity: Not Asked   Other Topics Concern  . None   Social History  Narrative   Drinks 1 cup of coffee a day     Her Allergies Are:  Allergies  Allergen Reactions  . Penicillins Other (See Comments)    As a child, doesn't remember reaction.    :   Her Current Medications Are:  Outpatient Encounter Prescriptions as of 01/26/2017  Medication Sig  . aspirin EC 81 MG tablet Take 81 mg by mouth daily.  Marland Kitchen CALCIUM PO Take 2 tablets by mouth daily.  . citalopram (CELEXA) 20 MG tablet Take 20 mg by mouth daily.  . colestipol (COLESTID) 1 g tablet Take 1 g by mouth 2 (two) times daily.  Marland Kitchen  CRANBERRY PO Take 1 tablet by mouth daily.  . cyanocobalamin (,VITAMIN B-12,) 1000 MCG/ML injection Inject 1,000 mcg into the muscle every 30 (thirty) days.   Marland Kitchen lisinopril (PRINIVIL,ZESTRIL) 10 MG tablet Take 10 mg by mouth every evening.  . loratadine (CLARITIN) 10 MG tablet Take 10 mg by mouth daily.  . meloxicam (MOBIC) 15 MG tablet Take 15 mg by mouth daily.  . metFORMIN (GLUCOPHAGE) 850 MG tablet Take 850 mg by mouth 2 (two) times daily.  . Multiple Vitamin (MULTIVITAMIN WITH MINERALS) TABS tablet Take 1 tablet by mouth daily.  . pioglitazone (ACTOS) 30 MG tablet Take 30 mg by mouth 2 (two) times daily.  . Vitamin D, Ergocalciferol, (DRISDOL) 50000 units CAPS capsule Take 50,000 Units by mouth 2 (two) times a week. On Monday and Thursday   No facility-administered encounter medications on file as of 01/26/2017.   :  Review of Systems:  Out of a complete 14 point review of systems, all are reviewed and negative with the exception of these symptoms as listed below: Review of Systems  Neurological:       Patient gets up in the night to go to the bathroom, snores, denies taking naps.    Epworth Sleepiness Scale 0= would never doze 1= slight chance of dozing 2= moderate chance of dozing 3= high chance of dozing  Sitting and reading:3 Watching TV:2 Sitting inactive in a public place (ex. Theater or meeting):1 As a passenger in a car for an hour without a break:2 Lying down to rest in the afternoon:1 Sitting and talking to someone:3 Sitting quietly after lunch (no alcohol):0 In a car, while stopped in traffic:0 Total:12  Objective:  Neurologic Exam  Physical Exam Physical Examination:   Vitals:   01/26/17 0833  BP: (!) 148/80  Pulse: 74  Resp: 16   General Examination: The patient is a very pleasant 66 y.o. female in no acute distress. She appears well-developed and well-nourished and very well groomed.   HEENT: Normocephalic, atraumatic, pupils are equal, round and  reactive to light and accommodation. Funduscopic exam is difficult, she has bilateral cataracts. Extraocular tracking is good without limitation to gaze excursion or nystagmus noted. Normal smooth pursuit is noted. Hearing is grossly intact. Face is symmetric with normal facial animation and normal facial sensation. Speech is clear with no dysarthria noted. There is no hypophonia. There is no lip, neck/head, jaw or voice tremor. Neck is supple with full range of passive and active motion. There are no carotid bruits on auscultation. Oropharynx exam reveals: mild mouth dryness, adequate dental hygiene and mild airway crowding, due to tonsils in place. Mallampati is class I. Tongue protrudes centrally and palate elevates symmetrically. Tonsils are 1+ in size. Neck size is 14 7/8 inches. She has a Mild overbite. Nasal inspection reveals no significant nasal mucosal bogginess or redness and  no septal deviation.   Chest: Clear to auscultation without wheezing, rhonchi or crackles noted.  Heart: S1+S2+0, regular and normal without murmurs, rubs or gallops noted.   Abdomen: Soft, non-tender and non-distended with normal bowel sounds appreciated on auscultation.  Extremities: There is no pitting edema in the distal lower extremities bilaterally. Pedal pulses are intact.  Skin: Warm and dry without trophic changes noted.  Musculoskeletal: exam reveals no obvious joint deformities, tenderness or joint swelling or erythema.   Neurologically:  Mental status: The patient is awake, alert and oriented in all 4 spheres. Her immediate and remote memory, attention, language skills and fund of knowledge are appropriate. There is no evidence of aphasia, agnosia, apraxia or anomia. Speech is clear with normal prosody and enunciation. Thought process is linear. Mood is normal and affect is normal.  Cranial nerves II - XII are as described above under HEENT exam. In addition: shoulder shrug is normal with equal shoulder  height noted. Motor exam: Normal bulk, strength and tone is noted. There is no drift, tremor or rebound. Romberg is negative. Reflexes are 2+ throughout. Fine motor skills and coordination: intact with normal finger taps, normal hand movements, normal rapid alternating patting, normal foot taps and normal foot agility.  Cerebellar testing: No dysmetria or intention tremor on finger to nose testing. Heel to shin is unremarkable bilaterally. There is no truncal or gait ataxia.  Sensory exam: intact to light touch in the upper and lower extremities.  Gait, station and balance: She stands easily. No veering to one side is noted. No leaning to one side is noted. Posture is Cook-appropriate and stance is narrow based. Gait shows normal stride length and normal pace. No problems turning are noted. Tandem walk is good for Cook.   Assessment and Plan:  In summary, TEMPIE GIBEAULT is a very pleasant 66 y.o.-year old female with an underlying medical history of hypertension, hyperlipidemia, diabetes, asthma, prior left basal ganglia lacunar infarct, recent hospitalization in from 01/22/2017 through 01/23/2017 for suspected TIA and transient word finding difficulties, Likely atypical headache versus complicated migraine who reports snoring and excessive daytime somnolence as well as desaturations noted during her hospital stay. Her history and physical exam are concerning for obstructive sleep apnea (OSA). I had a long chat with the patient about my findings and the diagnosis of OSA, its prognosis and treatment options. We talked about medical treatments, surgical interventions and non-pharmacological approaches. I explained in particular the risks and ramifications of untreated moderate to severe OSA, especially with respect to developing cardiovascular disease down the Road, including congestive heart failure, difficult to treat hypertension, cardiac arrhythmias, or stroke. Even type 2 diabetes has, in part, been linked to  untreated OSA. Symptoms of untreated OSA include daytime sleepiness, memory problems, mood irritability and mood disorder such as depression and anxiety, lack of energy, as well as recurrent headaches, especially morning headaches. We talked about trying to maintain a healthy lifestyle in general, as well as the importance of weight control. I encouraged the patient to eat healthy, exercise daily and keep well hydrated, to keep a scheduled bedtime and wake time routine, to not skip any meals and eat healthy snacks in between meals. I advised the patient not to drive when feeling sleepy. I recommended the following at this time: sleep study with potential positive airway pressure titration. (We will score hypopneas at 4%).   I explained the sleep test procedure to the patient and also outlined possible surgical and non-surgical treatment options of  OSA, including the use of a custom-made dental device (which would require a referral to a specialist dentist or oral surgeon), upper airway surgical options, such as pillar implants, radiofrequency surgery, tongue base surgery, and UPPP (which would involve a referral to an ENT surgeon). Rarely, jaw surgery such as mandibular advancement may be considered.  I also explained the CPAP treatment option to the patient, who indicated that she would be willing to try CPAP if the need arises. I explained the importance of being compliant with PAP treatment, not only for insurance purposes but primarily to improve Her symptoms, and for the patient's long term health benefit, including to reduce Her cardiovascular risks. I answered all her questions today and the patient was in agreement. I would like to see her back after the sleep study is completed and encouraged her to call with any interim questions, concerns, problems or updates. She is advised to make an appointment with you as well.  Thank you very much for allowing me to participate in the care of this nice  patient. If I can be of any further assistance to you please do not hesitate to talk to me.  Sincerely,   Bonnie Age, MD, PhD

## 2017-02-03 ENCOUNTER — Telehealth: Payer: Self-pay | Admitting: Neurology

## 2017-02-03 DIAGNOSIS — G43119 Migraine with aura, intractable, without status migrainosus: Secondary | ICD-10-CM | POA: Diagnosis not present

## 2017-02-03 NOTE — Telephone Encounter (Addendum)
I saw Bonnie Cook in hospital for transient word finding difficulties followed by HA. EEG showed left temporal slowing. Stroke work up negative. Felt more likely complicated migraine vs. Seizure. Recommend repeat EEG as outpt.   Bonnie Cook saw Dr. Rexene Alberts 01/29/17 for suspected OSA. Therefore, preferably, I would like to see if Dr. Rexene Alberts can accommodate her before 04/12/17. If not possible, I would like to see if Dr. Jaynee Eagles can see her for possible complicated migraine and may repeat EEG.   Hi, [name], can you check with Dr. Rexene Alberts first? If not able to, could you please check with Dr. Jaynee Eagles? If either is not possible, please double book with me. Thanks   Rosalin Hawking, MD PhD Stroke Neurology 02/03/2017 5:12 PM

## 2017-02-03 NOTE — Telephone Encounter (Signed)
Spoke to pts daughter, Vicente Males, on [place].  Pt in [facility] for stroke eval and was work up negative.  She has no hx migraines.  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  Did not hurt herself per daughter.  Bp 148/70. Became pale. Spent night with daughter who is Therapist, sports. Assessment per daughter (no numbness, weakness, slurred speech) .   This am was doing ok.   Has appt with Dr. Erlinda Hong 04-04-17 for RV.  She as appt with pcp on Tuesday.  I recommended to call pcp to see sooner relating to headaches.  I would see if can see sooner by NP or MD.  She was ok with this.  If things worsen may need urgent care over weekend.

## 2017-02-03 NOTE — Telephone Encounter (Signed)
Patients daughter Vicente Males (listed on DPR) called in reference to patient having new onset of headache no prior headaches until recent hospital stay.  Patient has a headache last night resulting in falling.  Daughter would like a sooner appointment.  Please call

## 2017-02-03 NOTE — Telephone Encounter (Signed)
Noted  

## 2017-02-03 NOTE — Telephone Encounter (Signed)
Spoke to daughter.  Pt did see her pcp at urgent care.  Given toradol injection and fiorcet for her headaches.  Would like her seen prior to 04/12/17.

## 2017-02-03 NOTE — Telephone Encounter (Signed)
Can follow up with PCP for headaches who can manage this . Keep appt with DR. Erlinda Hong

## 2017-02-06 NOTE — Telephone Encounter (Signed)
She should see Dr. Rexene Alberts thanks

## 2017-02-07 DIAGNOSIS — R0683 Snoring: Secondary | ICD-10-CM | POA: Diagnosis not present

## 2017-02-07 DIAGNOSIS — E782 Mixed hyperlipidemia: Secondary | ICD-10-CM | POA: Diagnosis not present

## 2017-02-07 DIAGNOSIS — Z6830 Body mass index (BMI) 30.0-30.9, adult: Secondary | ICD-10-CM | POA: Diagnosis not present

## 2017-02-07 DIAGNOSIS — I10 Essential (primary) hypertension: Secondary | ICD-10-CM | POA: Diagnosis not present

## 2017-02-08 NOTE — Telephone Encounter (Signed)
Patient seen by Dr. Rexene Alberts on 3/15.

## 2017-02-10 NOTE — Telephone Encounter (Signed)
Pt will be seen 02-27-17 with Dr.Xu.

## 2017-02-27 ENCOUNTER — Ambulatory Visit: Payer: Self-pay | Admitting: Neurology

## 2017-02-27 ENCOUNTER — Telehealth: Payer: Self-pay | Admitting: *Deleted

## 2017-02-27 NOTE — Telephone Encounter (Signed)
Called and LVM pt letting her know appt cx today with Dr Erlinda Hong. His nurse will call back to r/s her appt. Apologized for any inconvenience.

## 2017-02-28 NOTE — Telephone Encounter (Signed)
Patient is schedule for Apr 12, 2017 with Dr. Erlinda Hong.

## 2017-03-01 ENCOUNTER — Encounter (INDEPENDENT_AMBULATORY_CARE_PROVIDER_SITE_OTHER): Payer: Self-pay

## 2017-03-01 ENCOUNTER — Encounter: Payer: Self-pay | Admitting: Neurology

## 2017-03-01 ENCOUNTER — Ambulatory Visit (INDEPENDENT_AMBULATORY_CARE_PROVIDER_SITE_OTHER): Payer: PPO | Admitting: Neurology

## 2017-03-01 VITALS — BP 126/69 | HR 69 | Ht 62.0 in | Wt 177.8 lb

## 2017-03-01 DIAGNOSIS — E7800 Pure hypercholesterolemia, unspecified: Secondary | ICD-10-CM

## 2017-03-01 DIAGNOSIS — G43109 Migraine with aura, not intractable, without status migrainosus: Secondary | ICD-10-CM

## 2017-03-01 DIAGNOSIS — R519 Headache, unspecified: Secondary | ICD-10-CM | POA: Insufficient documentation

## 2017-03-01 DIAGNOSIS — R51 Headache: Secondary | ICD-10-CM

## 2017-03-01 DIAGNOSIS — R9401 Abnormal electroencephalogram [EEG]: Secondary | ICD-10-CM | POA: Diagnosis not present

## 2017-03-01 DIAGNOSIS — I10 Essential (primary) hypertension: Secondary | ICD-10-CM

## 2017-03-01 NOTE — Patient Instructions (Addendum)
-   continue ASA for stroke prevention - your symptoms and headache more consistent with complicated migraine.  - recommend to start either tylenol or ibuprofen or fioricet as early as possible to see if HA can be aborted. For fioricet, take no more than 2 a day and 5 for a week.  - will draw blood to rule out temporal arteritis.  - repeat EEG due to abnormal EEG during hospitalization.  - do not think you need daily preventive medications since HA is not frequent - will try to do prior authorization to see if your insurance cover the medication for cholesteorol - relaxation and de-stress still needed.  - keep 04/12/17 appointment with me.

## 2017-03-01 NOTE — Progress Notes (Addendum)
STROKE NEUROLOGY FOLLOW UP NOTE  NAME: Bonnie Cook DOB: April 29, 1951  REASON FOR VISIT: stroke follow up HISTORY FROM: pt  Today we had the pleasure of seeing Bonnie Cook in follow-up at our Neurology Clinic. Pt was accompanied by no one.   History Summary Bonnie Cook is a 66 y.o. female with history of HTN, DM, HLD and asthma admitted on 01/22/17 for expressive aphasia, nausea, brief right hand numbness followed by HA. She did not receive IV t-PA due to symptoms resolved. CT no acute abnormality and MRI negative for acute stroke but old left BG lacune. MRA, CUS, TTE unremarkable. LDL 132 and A1C 5.6. EEG showed left temporal slowing but no seizure. Recommend repeat EEG as outpt. Her symptoms resolved, but concern for complicated migraine vs. Seizure. Continued on ASA on discharge.   Interval History During the interval time, pt stated that she continued to have two more episode of HA. 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 She fell to the ground. She was sent home, had HA bifrontal lasted from 7:30pm to 11pm until she went to sleep. Second day morning, she felt good, went to work, at 8:30am, she had pounding HA, b/l temporal frontal region, denies photo and phono phobia, no N/V. Went to see her PCP, gave a shot and prescribed fioricet. She went home and slept and HA resolved after awaken at 4pm. Since then, she has had no more HA episode. She denies migraine history, but her daughter has migraine. She admitted that she has some recent stress and about to retire in 2 weeks.   Her LDL still high, but not able to tolerate statins in the past including lipitor, crestor, zocor, and pravastatin. All statins "kill her legs".     REVIEW OF SYSTEMS: Full 14 system review of systems performed and notable only for those listed below and in HPI above, all others are negative:  Constitutional:     Cardiovascular:  Ear/Nose/Throat:   Skin:  Eyes:   Respiratory:   Gastroitestinal:   Genitourinary:  Hematology/Lymphatic:   Endocrine:  Musculoskeletal:   Allergy/Immunology:   Neurological:  HA, numbness, speech difficulty,  Psychiatric:  Sleep:   The following represents the patient's updated allergies and side effects list: Allergies  Allergen Reactions  . Penicillins Other (See Comments)    As a child, doesn't remember reaction.      The neurologically relevant items on the patient's problem list were reviewed on today's visit.  Neurologic Examination  A problem focused neurological exam (12 or more points of the single system neurologic examination, vital signs counts as 1 point, cranial nerves count for 8 points) was performed.  Blood pressure 126/69, pulse 69, height _0  (1.575 m), weight 177 lb 12.8 oz (80.6 kg).  General - Well nourished, well developed, in no apparent distress.  Ophthalmologic - Sharp disc margins OU.  Cardiovascular - Regular rate and rhythm with no murmur.  Mental Status -  Level of arousal and orientation to time, place, and person were intact. Language including expression, naming, repetition, comprehension was assessed and found intact. Attention span and concentration were normal. Fund of Knowledge was assessed and was intact.  Cranial Nerves II - XII - II - Visual field intact OU. III, IV, VI - Extraocular movements intact. V - Facial sensation intact bilaterally. VII - Facial movement intact bilaterally. VIII -  Hearing & vestibular intact bilaterally. X - Palate elevates symmetrically. XI - Chin turning & shoulder shrug intact bilaterally. XII - Tongue protrusion intact.  Motor Strength - The patient's strength was normal in all extremities and pronator drift was absent.  Bulk was normal and fasciculations were absent.   Motor Tone - Muscle tone was assessed at the neck and appendages and was normal.  Reflexes - The  patient's reflexes were 1+ in all extremities and she had no pathological reflexes.  Sensory - Light touch, temperature/pinprick, vibration and proprioception, and Romberg testing were assessed and were normal.    Coordination - The patient had normal movements in the hands and feet with no ataxia or dysmetria.  Tremor was absent.  Gait and Station - The patient's transfers, posture, gait, station, and turns were observed as normal.  Data reviewed: I personally reviewed the images and agree with the radiology interpretations.  Ct Head Code Stroke W/o Cm 01/22/2017 1. Remote infarct involving the anterior limb of the left internal capsule with associated ex vacuo dilation of the left lateral ventricle. 2. Moderate atrophy and white matter disease. 3. No acute intracranial abnormality. 4. ASPECTS is 10/10   Mr Brain Wo Contrast 01/23/2017 1. No acute intracranial process. 2. Remote left basal ganglia lacunar infarct. 3. Mild chronic microvascular ischemic disease.   Mr Jodene Nam Head/brain Wo Cm 01/23/2017 Normal intracranial MRA.   CUS -  Findings are consistent with a 1-39 percent stenosis involving the right internal carotid artery and the left internal carotid artery. The vertebral arteries demonstrate antegrade flow.  EEG -  This awake and asleep EEG is abnormal due to occasional focal slowing over the left temporalregion. Clinical Correlation of the above findings indicates focal cerebral dysfunction over the left temporalregion suggestive of underlying structural or physiologic abnormality. The absence of epileptiform discharges does not exclude a clinical diagnosis of epilepsy. Clinical correlation is advised.  TTE - Left ventricle: The cavity size was normal. Wall thickness was   normal. Systolic function was normal. The estimated ejection   fraction was in the range of 60% to 65%. Wall motion was normal;   there were no regional wall motion abnormalities. There was no    evidence of elevated ventricular filling pressure by Doppler   parameters. Impressions: - No cardiac source of emboli was indentified.  Component     Latest Ref Rng & Units 01/23/2017  Cholesterol     0 - 200 mg/dL 207 (H)  Triglycerides     <150 mg/dL 67  HDL Cholesterol     >40 mg/dL 62  Total CHOL/HDL Ratio     RATIO 3.3  VLDL     0 - 40 mg/dL 13  LDL (calc)     0 - 99 mg/dL 132 (H)  Hemoglobin A1C     4.8 - 5.6 % 5.6  Mean Plasma Glucose     mg/dL 114    Assessment: As you may recall, she is a 66 y.o. Caucasian female with PMH of HTN, DM, HLD and asthma admitted on 01/22/17 for expressive aphasia, nausea, brief right hand numbness followed by HA. CT no acute abnormality and MRI negative for acute stroke but old left BG lacune. MRA, CUS, TTE unremarkable. LDL 132 and A1C 5.6. EEG showed left temporal slowing but no seizure. Her symptoms resolved, but concern for complicated migraine vs. Seizure. Recommend repeat EEG as outpt. Continued on ASA on discharge. After discharge, she had two more episode of HA, one associated with  left hand and left leg weakness. HA resolved after sleep. She denies photo and phono phobia, no N/V, migraine history, but her daughter has migraine. Condition still more consistent with complicated migraine. Her LDL still high, but not able to tolerate statins in the past including lipitor, crestor, zocor, and pravastatin.   Plan:  - continue ASA for stroke prevention - recommend to start either tylenol or fioricet as early as possible for HA abortive treatment. For fioricet, take no more than 2 a day and 5 for a week.  - will check ESR and CRP to rule out temporal arteritis.  - repeat EEG   - do not think you need daily preventive medications since HA is not frequent - will try to do prior authorization to see if your insurance cover the medication for cholesteorol - relaxation and de-stress still needed.  - keep 04/12/17 appointment with me.  I spent more  than 25 minutes of face to face time with the patient. Greater than 50% of time was spent in counseling and coordination of care. We discussed HA treatment, blood test and repeat EEG. Answered all her and her daughter's questions.    Orders Placed This Encounter  Procedures  . Sedimentation rate  . C-reactive protein  . CBC (no diff)  . CMP  . EEG adult    Standing Status:   Future    Standing Expiration Date:   03/02/2018    Meds ordered this encounter  Medications  . Evolocumab (REPATHA) 140 MG/ML SOSY    Sig: Inject 140 mg into the skin every 14 (fourteen) days.    Dispense:  2 mL    Refill:  5    Patient Instructions  - continue ASA for stroke prevention - your symptoms and headache more consistent with complicated migraine.  - recommend to start either tylenol or ibuprofen or fioricet as early as possible to see if HA can be aborted. For fioricet, take no more than 2 a day and 5 for a week.  - will draw blood to rule out temporal arteritis.  - repeat EEG due to abnormal EEG during hospitalization.  - do not think you need daily preventive medications since HA is not frequent - will try to do prior authorization to see if your insurance cover the medication for cholesteorol - relaxation and de-stress still needed.  - keep 04/12/17 appointment with me.   Rosalin Hawking, MD PhD [facility] [address], [place], [place] [address] [phone]

## 2017-03-02 ENCOUNTER — Telehealth: Payer: Self-pay

## 2017-03-02 DIAGNOSIS — G43109 Migraine with aura, not intractable, without status migrainosus: Secondary | ICD-10-CM | POA: Insufficient documentation

## 2017-03-02 DIAGNOSIS — R9401 Abnormal electroencephalogram [EEG]: Secondary | ICD-10-CM | POA: Insufficient documentation

## 2017-03-02 LAB — COMPREHENSIVE METABOLIC PANEL
ALT: 18 IU/L (ref 0–32)
AST: 26 IU/L (ref 0–40)
Albumin/Globulin Ratio: 1.6 (ref 1.2–2.2)
Albumin: 4.1 g/dL (ref 3.6–4.8)
Alkaline Phosphatase: 64 IU/L (ref 39–117)
BUN/Creatinine Ratio: 19 (ref 12–28)
BUN: 23 mg/dL (ref 8–27)
Bilirubin Total: <0.2 mg/dL (ref 0.0–1.2)
CO2: 29 mmol/L (ref 18–29)
CREATININE: 1.19 mg/dL — AB (ref 0.57–1.00)
Calcium: 9.7 mg/dL (ref 8.7–10.3)
Chloride: 99 mmol/L (ref 96–106)
GFR calc Af Amer: 55 mL/min/{1.73_m2} — ABNORMAL LOW (ref >59)
GFR calc non Af Amer: 47 mL/min/{1.73_m2} — ABNORMAL LOW (ref >59)
GLOBULIN, TOTAL: 2.5 g/dL (ref 1.5–4.5)
GLUCOSE: 116 mg/dL — AB (ref 65–99)
Potassium: 5.4 mmol/L — ABNORMAL HIGH (ref 3.5–5.2)
SODIUM: 141 mmol/L (ref 134–144)
Total Protein: 6.6 g/dL (ref 6.0–8.5)

## 2017-03-02 LAB — CBC
HEMATOCRIT: 37.9 % (ref 34.0–46.6)
Hemoglobin: 12.5 g/dL (ref 11.1–15.9)
MCH: 29.2 pg (ref 26.6–33.0)
MCHC: 33 g/dL (ref 31.5–35.7)
MCV: 89 fL (ref 79–97)
PLATELETS: 277 10*3/uL (ref 150–379)
RBC: 4.28 x10E6/uL (ref 3.77–5.28)
RDW: 14.9 % (ref 12.3–15.4)
WBC: 5.5 10*3/uL (ref 3.4–10.8)

## 2017-03-02 LAB — C-REACTIVE PROTEIN: CRP: 2.2 mg/L (ref 0.0–4.9)

## 2017-03-02 LAB — SEDIMENTATION RATE: SED RATE: 2 mm/h (ref 0–40)

## 2017-03-02 MED ORDER — EVOLOCUMAB 140 MG/ML ~~LOC~~ SOSY: 140.0000 mg | PREFILLED_SYRINGE | SUBCUTANEOUS | 5 refills | Status: DC

## 2017-03-02 NOTE — Addendum Note (Signed)
Addended by: Rosalin Hawking on: 03/02/2017 01:14 PM   Modules accepted: Orders

## 2017-03-02 NOTE — Telephone Encounter (Signed)
Left vm for patient to find out estimation start dates for lipitor/Crestor/Zocor/ and Pravastatin for repatha. Application will be sent to [place] once everything is receive and filled out.

## 2017-03-02 NOTE — Telephone Encounter (Signed)
-----   Message from Rosalin Hawking, MD sent at 03/02/2017  9:36 AM EDT ----- Could you please let the patient know that the blood test done yesterday in our office was unremarkable. Please continue current treatment. Thanks.  Rosalin Hawking, MD PhD Stroke Neurology 03/02/2017 9:36 AM

## 2017-03-02 NOTE — Telephone Encounter (Signed)
Left vm for patient to call back about lab work results. ------ 

## 2017-03-06 NOTE — Telephone Encounter (Signed)
Left vm for patient to call back about lab work. 

## 2017-03-07 ENCOUNTER — Ambulatory Visit (INDEPENDENT_AMBULATORY_CARE_PROVIDER_SITE_OTHER): Payer: PPO | Admitting: Neurology

## 2017-03-07 DIAGNOSIS — G4720 Circadian rhythm sleep disorder, unspecified type: Secondary | ICD-10-CM

## 2017-03-07 DIAGNOSIS — G4710 Hypersomnia, unspecified: Secondary | ICD-10-CM

## 2017-03-07 DIAGNOSIS — G4734 Idiopathic sleep related nonobstructive alveolar hypoventilation: Secondary | ICD-10-CM | POA: Diagnosis not present

## 2017-03-07 NOTE — Telephone Encounter (Signed)
Rn receive incoming call from patient. Rn stated her lab work was unremarkable. Pt verbalized understanding.

## 2017-03-07 NOTE — Telephone Encounter (Signed)
Rn receive incoming call from patient regarding the statins she has failed. Pt stated she fail lipitor, crestor, and zocor only. Pt stated zocor 40mg  started 11/15/2007  Stop 06/14/2009,, Crestor 10mg  started 1/15/20008 stop 08/21/2008, Lipitor 10mg  started 07/2004 ended may 2006.

## 2017-03-09 NOTE — Telephone Encounter (Signed)
PA application sent to [place] at [phone] for repatha. PA pending.

## 2017-03-10 NOTE — Procedures (Signed)
PATIENT'S NAME:  Bonnie Cook, Boullion DOB:      08/10/1951      MR#:    [id number]     DATE OF RECORDING: 03/07/2017 REFERRING M.D.: Rosalin Hawking, MD,    PCP: Janace Litten, MD Study Performed:   Baseline Polysomnogram HISTORY: 66 year old woman with a history of hypertension, hyperlipidemia, diabetes, asthma, prior left basal ganglia lacunar infarct, suspected TIA, who reports snoring and excessive daytime somnolence as well as desaturations noted during her hospital stay. The patient endorsed the Epworth Sleepiness Scale at 12 points. The patient's weight 176 pounds with a height of 63 (inches), resulting in a BMI of 31.3 kg/m2. The patient's neck circumference measured 14.8 inches.  CURRENT MEDICATIONS: Celexa, Colestid, Vitamin B12, Lisinopril, Claritin, Mobic, Glucophage, Actos, Multivitamin, Drisdol   PROCEDURE:  This is a multichannel digital polysomnogram utilizing the Somnostar 11.2 system.  Electrodes and sensors were applied and monitored per AASM Specifications.   EEG, EOG, Chin and Limb EMG, were sampled at 200 Hz.  ECG, Snore and Nasal Pressure, Thermal Airflow, Respiratory Effort, CPAP Flow and Pressure, Oximetry was sampled at 50 Hz. Digital video and audio were recorded.      BASELINE STUDY  Lights Out was at 21:12 and Lights On at 05:00.  Total recording time (TRT) was 469 minutes, with a total sleep time (TST) of  329 minutes.   The patient's sleep latency was 71.5 minutes, which is delayed.  REM latency was 365.5 minutes, which is markedly delayed.  The sleep efficiency was 70.1 %, which is reduced.     SLEEP ARCHITECTURE: WASO (Wake after sleep onset) was 84.5 minutes with moderate sleep fragmentation noted and one longer period of wakefulness.  There were 9.5 minutes in Stage N1, 198.5 minutes Stage N2, 83.5 minutes Stage N3 and 37.5 minutes in Stage REM.  The percentage of Stage N1 was 2.9%, Stage N2 was 60.3%, which is mildly increased, Stage N3 was 25.4%, which is mildly increased,  and Stage R (REM sleep) was 11.4%, which is reduced.   The arousals were noted as: 73 were spontaneous, 0 were associated with PLMs, 0 were associated with respiratory events.    Audio and video analysis did not show any abnormal or unusual movements, behaviors, phonations or vocalizations. The patient took 1 bathroom break. Mild intermittent snoring was noted. The EKG was in keeping with normal sinus rhythm (NSR).  RESPIRATORY ANALYSIS:  There were a total of 2 respiratory events:  0 obstructive apneas, 0 central apneas and 0 mixed apneas with a total of 0 apneas and an apnea index (AI) of 0 /hour. There were 2 hypopneas with a hypopnea index of .4 /hour. The patient also had 0 respiratory event related arousals (RERAs).      The total APNEA/HYPOPNEA INDEX (AHI) was .4/hour and the total RESPIRATORY DISTURBANCE INDEX was .4 /hour.  2 events occurred in REM sleep and 0 events in NREM. The REM AHI was 3.2 /hour, versus a non-REM AHI of 0. The patient spent 89 minutes of total sleep time in the supine position and 240 minutes in non-supine.. The supine AHI was 0.0 versus a non-supine AHI of 0.5.  OXYGEN SATURATION & C02:  The Wake baseline 02 saturation was 98%, with the lowest being 85%. Time spent below 89% saturation equaled 5 minutes.  PERIODIC LIMB MOVEMENTS: The patient had a total of 0 Periodic Limb Movements.  The Periodic Limb Movement (PLM) index was 0 and the PLM Arousal index was 0/hour.  Post-study, the patient indicated that sleep was the same as usual.   IMPRESSION:  1. Hypersomnia, unspecified 2. Dysfunctions associated with sleep stages or arousal from sleep  RECOMMENDATIONS:  1. This study does not demonstrate any significant obstructive or central sleep disordered breathing. This study does not support an intrinsic sleep disorder as a cause of the patient's symptoms. Other causes, including circadian rhythm disturbances, an underlying mood disorder, medication effect and/or  an underlying medical problem cannot be ruled out. 2. This study shows sleep fragmentation and abnormal sleep stage percentages; these are nonspecific findings and per se do not signify an intrinsic sleep disorder or a cause for the patient's sleep-related symptoms. Causes include (but are not limited to) the first night effect of the sleep study, circadian rhythm disturbances, medication effect or an underlying mood disorder or medical problem.  3. The patient should be cautioned not to drive, work at heights, or operate dangerous or heavy equipment when tired or sleepy. Review and reiteration of good sleep hygiene measures should be pursued with any patient. 4. The patient can follow-up with her referring provider, who will be notified of the test results.   I certify that I have reviewed the entire raw data recording prior to the issuance of this report in accordance with the Standards of Accreditation of the American Academy of Sleep Medicine (AASM)   Star Age, MD, PhD Diplomat, American Board of Psychiatry and Neurology (Neurology and Sleep Medicine)

## 2017-03-10 NOTE — Progress Notes (Signed)
Patient referred by Dr. Erlinda Hong, seen by me on 01/26/17, diagnostic PSG on 03/07/17.   Please call and notify the patient that the recent sleep study did not show any significant obstructive sleep apnea. Please inform patient that she can FU with Dr. Erlinda Hong in May.  Please remind patient to try to maintain good sleep hygiene, which means: Keep a regular sleep and wake schedule, try not to exercise or have a meal within 2 hours of your bedtime, try to keep your bedroom conducive for sleep, that is, cool and dark, without light distractors such as an illuminated alarm clock, and refrain from watching TV right before sleep or in the middle of the night and do not keep the TV or radio on during the night. Also, try not to use or play on electronic devices at bedtime, such as your cell phone, tablet PC or laptop. If you like to read at bedtime on an electronic device, try to dim the background light as much as possible. Do not eat in the middle of the night.   Also, route or fax report to PCP and referring MD, if other than PCP.  Once you have spoken to patient, you can close this encounter.   Thanks,  Star Age, MD, PhD [facility])

## 2017-03-10 NOTE — Telephone Encounter (Signed)
Rn call [place] at [phone]. Rn stated the application, documents for the PA repatha was fax 03/08/2017. The rep stated they did not receive the fax for the PA, and to send it again.  Rn fax [place] application again to [phone].

## 2017-03-13 ENCOUNTER — Ambulatory Visit (INDEPENDENT_AMBULATORY_CARE_PROVIDER_SITE_OTHER): Payer: PPO

## 2017-03-13 DIAGNOSIS — R4701 Aphasia: Secondary | ICD-10-CM

## 2017-03-13 DIAGNOSIS — R9401 Abnormal electroencephalogram [EEG]: Secondary | ICD-10-CM

## 2017-03-13 NOTE — Telephone Encounter (Signed)
Receive fax that application was receive by [place] for [place] PA still pending.

## 2017-03-13 NOTE — Procedures (Signed)
    History:  Bonnie Cook is a 66 year old patient with a history of hypertension, diabetes, and dyslipidemia who had an episode on 01/22/2017 of expressive aphasia and right hand numbness. MRI of the brain was unremarkable. The patient had an event on 02/02/2017 with headache and left hand and left leg weakness. She is being evaluated for these events.  This is a routine EEG. No skull defects are noted. Medications include aspirin, Celexa, colestipol, vitamin B12, Prinivil, Claritin, Mobic, metformin, Actos, and multivitamins.   EEG classification: Normal awake  Description of the recording: The background rhythms of this recording consists of a fairly well modulated medium amplitude alpha rhythm of 8 Hz that is reactive to eye opening and closure. As the record progresses, the patient appears to remain in the waking state throughout the recording. Photic stimulation was performed, resulting in a bilateral and symmetric photic driving response. Hyperventilation was also performed, resulting in a minimal buildup of the background rhythm activities without significant slowing seen. At no time during the recording does there appear to be evidence of spike or spike wave discharges or evidence of focal slowing. EKG monitor shows no evidence of cardiac rhythm abnormalities with a heart rate of 72.  Impression: This is a normal EEG recording in the waking state. No evidence of ictal or interictal discharges are seen.

## 2017-03-14 ENCOUNTER — Telehealth: Payer: Self-pay

## 2017-03-14 NOTE — Telephone Encounter (Signed)
-----   Message from Star Age, MD sent at 03/10/2017 11:49 AM EDT ----- Patient referred by Dr. Erlinda Hong, seen by me on 01/26/17, diagnostic PSG on 03/07/17.   Please call and notify the patient that the recent sleep study did not show any significant obstructive sleep apnea. Please inform patient that she can FU with Dr. Erlinda Hong in May.  Please remind patient to try to maintain good sleep hygiene, which means: Keep a regular sleep and wake schedule, try not to exercise or have a meal within 2 hours of your bedtime, try to keep your bedroom conducive for sleep, that is, cool and dark, without light distractors such as an illuminated alarm clock, and refrain from watching TV right before sleep or in the middle of the night and do not keep the TV or radio on during the night. Also, try not to use or play on electronic devices at bedtime, such as your cell phone, tablet PC or laptop. If you like to read at bedtime on an electronic device, try to dim the background light as much as possible. Do not eat in the middle of the night.   Also, route or fax report to PCP and referring MD, if other than PCP.  Once you have spoken to patient, you can close this encounter.   Thanks,  Star Age, MD, PhD [facility])

## 2017-03-14 NOTE — Telephone Encounter (Signed)
Rn call patient that her EEG was normal. Pt verbalized understanding.

## 2017-03-14 NOTE — Telephone Encounter (Signed)
LM with results below. Left reminder of up coming appt and left call back number for any further questions.

## 2017-03-14 NOTE — Telephone Encounter (Signed)
-----   Message from Rosalin Hawking, MD sent at 03/13/2017  6:31 PM EDT ----- Could you please let the patient know that the EEG test done recently in our office was normal. Please continue current treatment. Thanks.  Rosalin Hawking, MD PhD Stroke Neurology 03/13/2017 6:31 PM

## 2017-03-14 NOTE — Telephone Encounter (Signed)
Left vm for patient to cal back about her EEG results.

## 2017-03-14 NOTE — Telephone Encounter (Signed)
Patient called office returning RN's call.  Please call °

## 2017-03-24 DIAGNOSIS — R300 Dysuria: Secondary | ICD-10-CM | POA: Diagnosis not present

## 2017-03-24 DIAGNOSIS — Z6830 Body mass index (BMI) 30.0-30.9, adult: Secondary | ICD-10-CM | POA: Diagnosis not present

## 2017-03-30 NOTE — Telephone Encounter (Signed)
Rn call [facility] about the status of the patients repatha. The rep stated pt was approve for a 365.24 copayment. They have left her vm about doing financial help tp help with the cost. Rn gave them the pts cell number. Rn requested approval be sent to [phone] the fax number in her pod.

## 2017-04-06 NOTE — Telephone Encounter (Signed)
Thank you for your effort.  Rosalin Hawking, MD PhD Stroke Neurology 04/06/2017 11:34 AM

## 2017-04-10 DIAGNOSIS — J302 Other seasonal allergic rhinitis: Secondary | ICD-10-CM | POA: Diagnosis not present

## 2017-04-10 DIAGNOSIS — R05 Cough: Secondary | ICD-10-CM | POA: Diagnosis not present

## 2017-04-12 ENCOUNTER — Ambulatory Visit (INDEPENDENT_AMBULATORY_CARE_PROVIDER_SITE_OTHER): Payer: PPO | Admitting: Neurology

## 2017-04-12 ENCOUNTER — Encounter (INDEPENDENT_AMBULATORY_CARE_PROVIDER_SITE_OTHER): Payer: Self-pay

## 2017-04-12 ENCOUNTER — Encounter: Payer: Self-pay | Admitting: Neurology

## 2017-04-12 VITALS — BP 136/76 | HR 70 | Ht 63.0 in | Wt 175.0 lb

## 2017-04-12 DIAGNOSIS — G43109 Migraine with aura, not intractable, without status migrainosus: Secondary | ICD-10-CM | POA: Diagnosis not present

## 2017-04-12 DIAGNOSIS — E7800 Pure hypercholesterolemia, unspecified: Secondary | ICD-10-CM | POA: Diagnosis not present

## 2017-04-12 DIAGNOSIS — I10 Essential (primary) hypertension: Secondary | ICD-10-CM

## 2017-04-12 NOTE — Progress Notes (Signed)
STROKE NEUROLOGY FOLLOW UP NOTE  NAME: Bonnie Cook DOB: 09/14/1951  REASON FOR VISIT: stroke follow up HISTORY FROM: pt  Today we had the pleasure of seeing Bonnie Cook in follow-up at our Neurology Clinic. Pt was accompanied by no one.   History Summary [name] is a 66 y.o. female with history of HTN, DM, HLD and asthma admitted on 01/22/17 for expressive aphasia, nausea, brief right hand numbness followed by HA. She did not receive IV t-PA due to symptoms resolved. CT no acute abnormality and MRI negative for acute stroke but old left BG lacune. MRA, CUS, TTE unremarkable. LDL 132 and A1C 5.6. EEG showed left temporal slowing but no seizure. Recommend repeat EEG as outpt. Her symptoms resolved, but concern for complicated migraine vs. Seizure. Continued on ASA on discharge.   03/01/17 follow up - pt stated that she continued to have two more episode of HA. 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 She fell to the ground. She was sent home, had HA bifrontal lasted from 7:30pm to 11pm until she went to sleep. Second day morning, she felt good, went to work, at 8:30am, she had pounding HA, b/l temporal frontal region, denies photo and phono phobia, no N/V. Went to see her PCP, gave a shot and prescribed fioricet. She went home and slept and HA resolved after awaken at 4pm. Since then, she has had no more HA episode. She denies migraine history, but her daughter has migraine. She admitted that she has some recent stress and about to retire in 2 weeks.  Her LDL still high, but not able to tolerate statins in the past including lipitor, crestor, zocor, and pravastatin. All statins "kill her legs".    Interval History During the interval time, she has been doing well. No stroke like symptoms. She had one mild b/l temporal HA on 03/25/17 while waiting at a funeral. She took fioricet and HA went  away. Other than that, she feels good, no HA. She has not call [employer] yet for their financial program. Her ESR and CRP both normal and EEG repeat was normal too. BP today 136/76.  REVIEW OF SYSTEMS: Full 14 system review of systems performed and notable only for those listed below and in HPI above, all others are negative:  Constitutional:   Cardiovascular:  Ear/Nose/Throat:   Skin:  Eyes:   Respiratory:   Gastroitestinal:   Genitourinary:  Hematology/Lymphatic:   Endocrine:  Musculoskeletal:   Allergy/Immunology:   Neurological:   Psychiatric:  Sleep:   The following represents the patient's updated allergies and side effects list: Allergies  Allergen Reactions  . Erythromycin Base   . Penicillins Other (See Comments)    As a child, doesn't remember reaction.      The neurologically relevant items on the patient's problem list were reviewed on today's visit.  Neurologic Examination  A problem focused neurological exam (12 or more points of the single system neurologic examination, vital signs counts as 1 point, cranial nerves count for 8 points) was performed.  Blood pressure 136/76, pulse 70, height 5' 3"  (1.6 m), weight 175 lb (79.4 kg).  General - Well nourished, well developed, in no apparent distress.  Ophthalmologic - Sharp disc margins OU.  Cardiovascular - Regular rate and rhythm with no murmur.  Mental Status -  Level of arousal and orientation  to time, place, and person were intact. Language including expression, naming, repetition, comprehension was assessed and found intact. Attention span and concentration were normal. Fund of Knowledge was assessed and was intact.  Cranial Nerves II - XII - II - Visual field intact OU. III, IV, VI - Extraocular movements intact. V - Facial sensation intact bilaterally. VII - Facial movement intact bilaterally. VIII - Hearing & vestibular intact bilaterally. X - Palate elevates symmetrically. XI - Chin  turning & shoulder shrug intact bilaterally. XII - Tongue protrusion intact.  Motor Strength - The patient's strength was normal in all extremities and pronator drift was absent.  Bulk was normal and fasciculations were absent.   Motor Tone - Muscle tone was assessed at the neck and appendages and was normal.  Reflexes - The patient's reflexes were 1+ in all extremities and she had no pathological reflexes.  Sensory - Light touch, temperature/pinprick, vibration and proprioception, and Romberg testing were assessed and were normal.    Coordination - The patient had normal movements in the hands and feet with no ataxia or dysmetria.  Tremor was absent.  Gait and Station - The patient's transfers, posture, gait, station, and turns were observed as normal.  Data reviewed: I personally reviewed the images and agree with the radiology interpretations.  Ct Head Code Stroke W/o Cm 01/22/2017 1. Remote infarct involving the anterior limb of the left internal capsule with associated ex vacuo dilation of the left lateral ventricle. 2. Moderate atrophy and white matter disease. 3. No acute intracranial abnormality. 4. ASPECTS is 10/10   Mr Brain Wo Contrast 01/23/2017 1. No acute intracranial process. 2. Remote left basal ganglia lacunar infarct. 3. Mild chronic microvascular ischemic disease.   Mr Jodene Nam Head/brain Wo Cm 01/23/2017 Normal intracranial MRA.   CUS -  Findings are consistent with a 1-39 percent stenosis involving the right internal carotid artery and the left internal carotid artery. The vertebral arteries demonstrate antegrade flow.  EEG -  This awake and asleep EEG is abnormal due to occasional focal slowing over the left temporalregion. Clinical Correlation of the above findings indicates focal cerebral dysfunction over the left temporalregion suggestive of underlying structural or physiologic abnormality. The absence of epileptiform discharges does not exclude a clinical  diagnosis of epilepsy. Clinical correlation is advised.  TTE - Left ventricle: The cavity size was normal. Wall thickness was   normal. Systolic function was normal. The estimated ejection   fraction was in the range of 60% to 65%. Wall motion was normal;   there were no regional wall motion abnormalities. There was no   evidence of elevated ventricular filling pressure by Doppler   parameters. Impressions: - No cardiac source of emboli was indentified.  EEG 03/13/17  This is a normal EEG recording in the waking state. No evidence of ictal or interictal discharges are seen.  Sleep study - no significant OSA   Component     Latest Ref Rng & Units 01/23/2017  Cholesterol     0 - 200 mg/dL 207 (H)  Triglycerides     <150 mg/dL 67  HDL Cholesterol     >40 mg/dL 62  Total CHOL/HDL Ratio     RATIO 3.3  VLDL     0 - 40 mg/dL 13  LDL (calc)     0 - 99 mg/dL 132 (H)  Hemoglobin A1C     4.8 - 5.6 % 5.6  Mean Plasma Glucose     mg/dL 114   Component  Latest Ref Rng & Units 03/01/2017  Sed Rate     0 - 40 mm/hr 2  CRP     0.0 - 4.9 mg/L 2.2    Assessment: As you may recall, she is a 66 y.o. Caucasian female with PMH of HTN, DM, HLD and asthma admitted on 01/22/17 for expressive aphasia, nausea, brief right hand numbness followed by HA. CT no acute abnormality and MRI negative for acute stroke but old left BG lacune. MRA, CUS, TTE unremarkable. LDL 132 and A1C 5.6. EEG showed left temporal slowing but no seizure. Her symptoms resolved, but concern for complicated migraine vs. Seizure. Recommend repeat EEG as outpt. Continued on ASA on discharge. After discharge, she had three more episode of HA, one associated with left hand and left leg weakness. HA resolved after sleep or fioricet. She denies photo and phono phobia, no N/V, migraine history, but her daughter and uncle have migraine. Condition still more consistent with complicated migraine. Her LDL still high, but not able to  tolerate statins in the past including lipitor, crestor, zocor, and pravastatin. Pending company financial program. ESR and CRP WNL and repeat EEG normal.  Plan:  - continue ASA for stroke prevention - recommend to start either tylenol, ibuprofen or fioricet as early as possible for HA abortive treatment. For fioricet, take no more than 2 a day and 5 for a week.  - do not think you need daily preventive medications since HA is not frequent - call company for financial aid about the cholesterol injectable meds. - relaxation and de-stress still needed.  - follow up in 6 months with Hoyle Sauer. If stable, can be d/c.    No orders of the defined types were placed in this encounter.   Meds ordered this encounter  Medications  . albuterol (PROAIR HFA) 108 (90 Base) MCG/ACT inhaler    Sig: ProAir HFA 90 mcg/actuation aerosol inhaler  . DISCONTD: citalopram (CELEXA) 20 MG tablet    Sig: citalopram 20 mg tablet  . pioglitazone-metformin (ACTOPLUS MET) 15-850 MG tablet    Sig: pioglitazone 15 mg-metformin 850 mg tablet  . pioglitazone (ACTOS) 15 MG tablet    Sig: pioglitazone 15 mg tablet  . colesevelam (WELCHOL) 625 MG tablet    Sig: Take 625 mg by mouth daily.  Marland Kitchen DISCONTD: lisinopril (PRINIVIL,ZESTRIL) 10 MG tablet    Sig: lisinopril 10 mg tablet  . DISCONTD: meloxicam (MOBIC) 15 MG tablet    Sig: meloxicam 15 mg tablet  . BUTALBITAL-APAP-CAFF-COD PO    Sig: Take by mouth.    Patient Instructions  - continue ASA for stroke prevention - recommend to start either tylenol, ibuprofen or fioricet as early as possible for HA abortive treatment. For fioricet, take no more than 2 a day and 5 for a week.  - do not think you need daily preventive medications since HA is not frequent - call company for financial aid about the cholesterol injectable meds. - relaxation and de-stress still needed.  - follow up in 6 months.    Rosalin Hawking, MD PhD [facility] [address], [place], [place] [address] [phone]

## 2017-04-12 NOTE — Patient Instructions (Addendum)
-   continue ASA for stroke prevention - recommend to start either tylenol, ibuprofen or fioricet as early as possible for HA abortive treatment. For fioricet, take no more than 2 a day and 5 for a week.  - do not think you need daily preventive medications since HA is not frequent - call company for financial aid about the cholesterol injectable meds. - relaxation and de-stress still needed.  - follow up in 6 months.

## 2017-05-23 ENCOUNTER — Ambulatory Visit: Payer: Self-pay | Admitting: Neurology

## 2017-06-19 DIAGNOSIS — J40 Bronchitis, not specified as acute or chronic: Secondary | ICD-10-CM | POA: Diagnosis not present

## 2017-06-19 DIAGNOSIS — J329 Chronic sinusitis, unspecified: Secondary | ICD-10-CM | POA: Diagnosis not present

## 2017-06-19 DIAGNOSIS — J320 Chronic maxillary sinusitis: Secondary | ICD-10-CM | POA: Diagnosis not present

## 2017-07-11 DIAGNOSIS — E782 Mixed hyperlipidemia: Secondary | ICD-10-CM | POA: Diagnosis not present

## 2017-07-11 DIAGNOSIS — I10 Essential (primary) hypertension: Secondary | ICD-10-CM | POA: Diagnosis not present

## 2017-07-11 DIAGNOSIS — E1165 Type 2 diabetes mellitus with hyperglycemia: Secondary | ICD-10-CM | POA: Diagnosis not present

## 2017-07-11 DIAGNOSIS — Z8669 Personal history of other diseases of the nervous system and sense organs: Secondary | ICD-10-CM | POA: Diagnosis not present

## 2017-07-16 IMAGING — CT CT HEAD CODE STROKE
4 series · 16 of 47 positions shown, 18 images · non-contrast
Comparison: None.

CLINICAL DATA: Code stroke. Slurred speech. Right-sided headache.
Right hand numbness.

EXAM:
CT HEAD WITHOUT CONTRAST
TECHNIQUE: Contiguous axial images were obtained from the base of the skull
through the vertex without intravenous contrast.

[Series 3: head 5.0 st · axial · 0.43mm/px · z∈[-101,+19]mm · 7 of 34 slices shown, 9 images]
[im 5/34  brain]
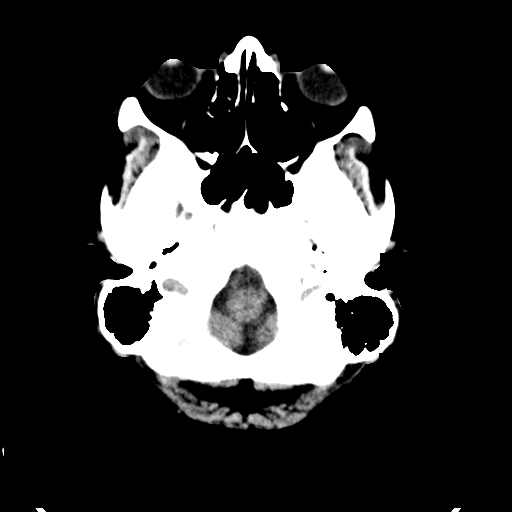
[im 5/34  bone]
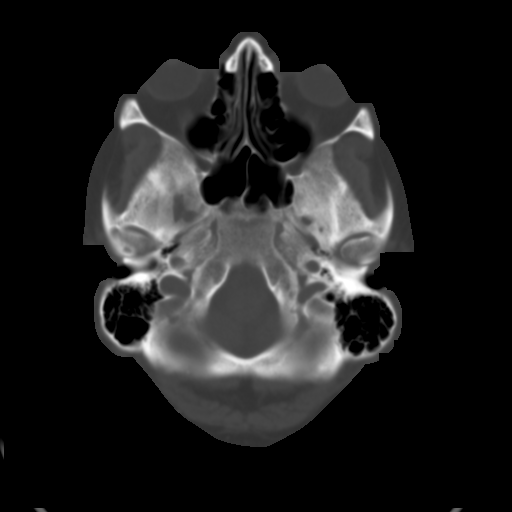
[im 9/34  brain]
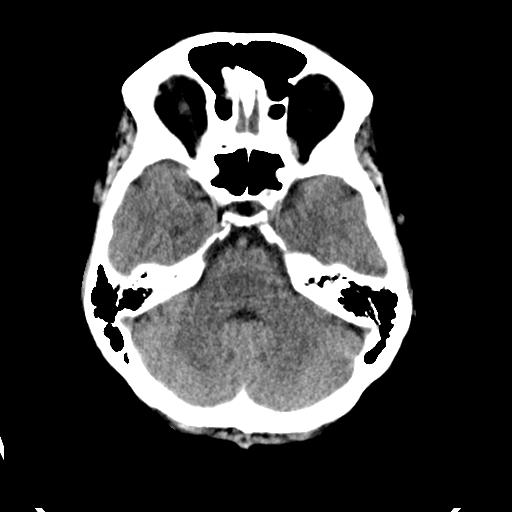
[im 13/34  brain]
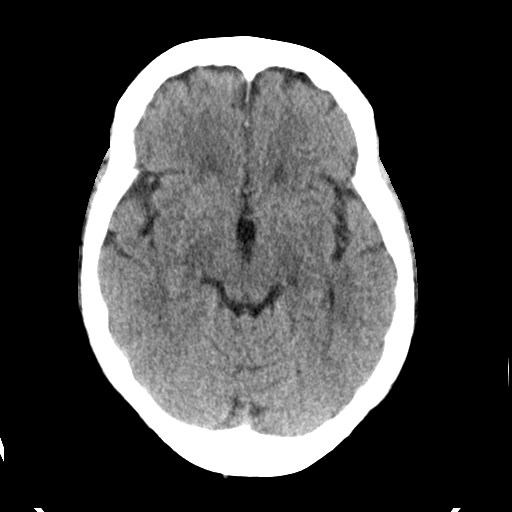
[im 17/34  brain]
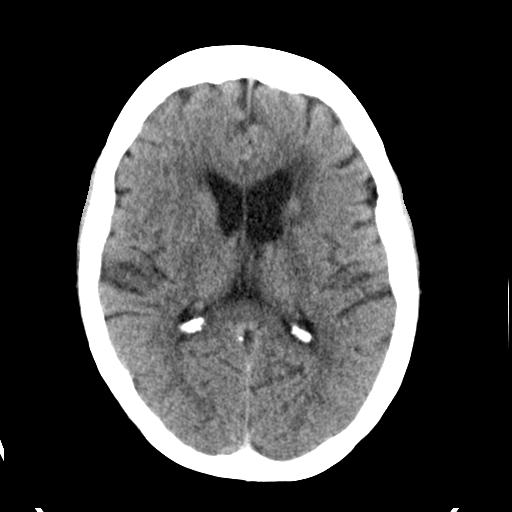
[im 21/34  brain]
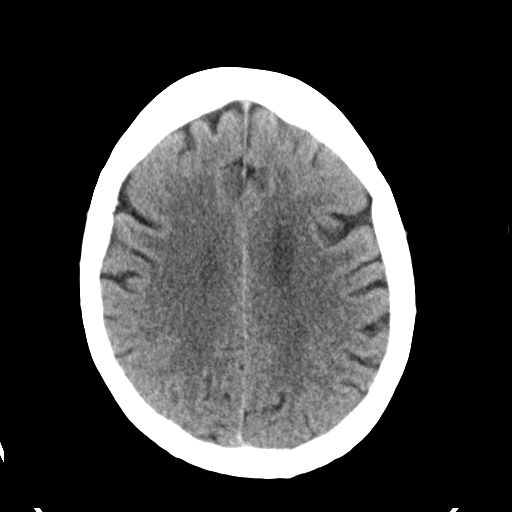
[im 21/34  bone]
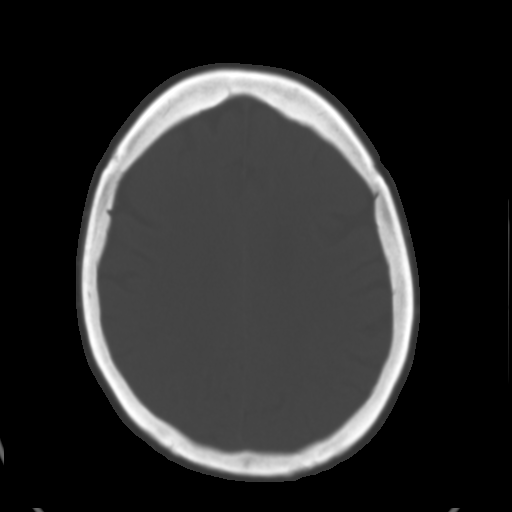
[im 25/34  brain]
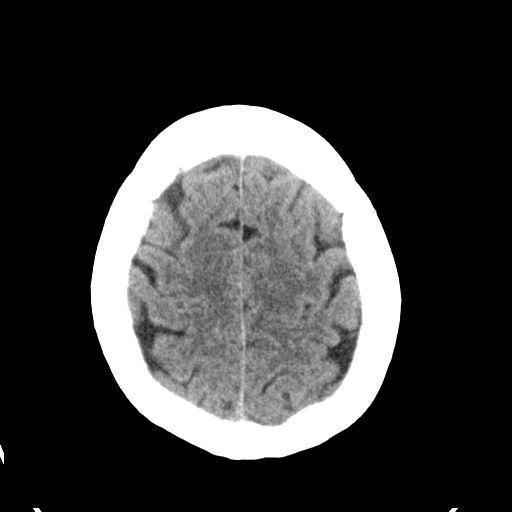
[im 29/34  brain]
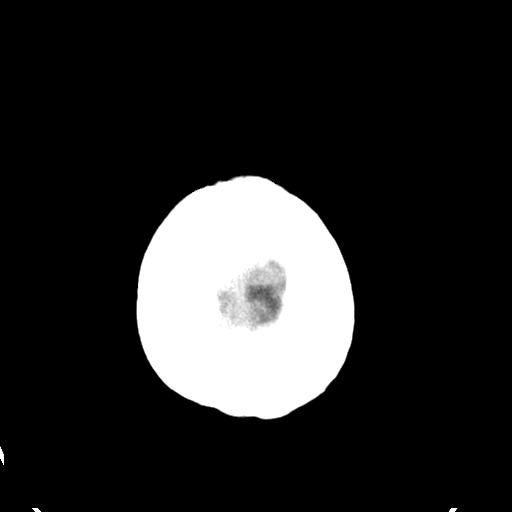

[Series 4: head 2.0 bone · axial · 0.43mm/px · z∈[-105,-71]mm · 3 of 85 slices shown]
[im 9/85  bone]
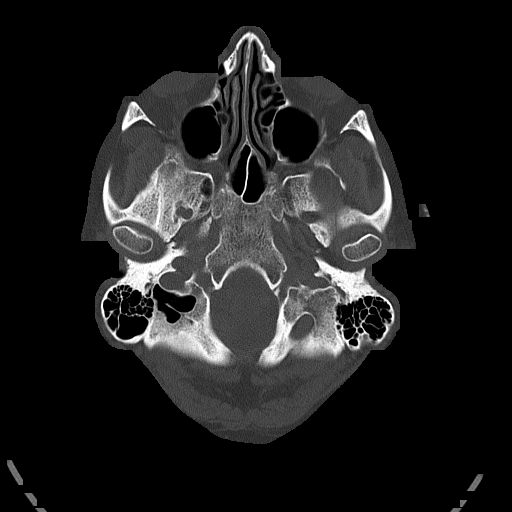
[im 17/85  bone]
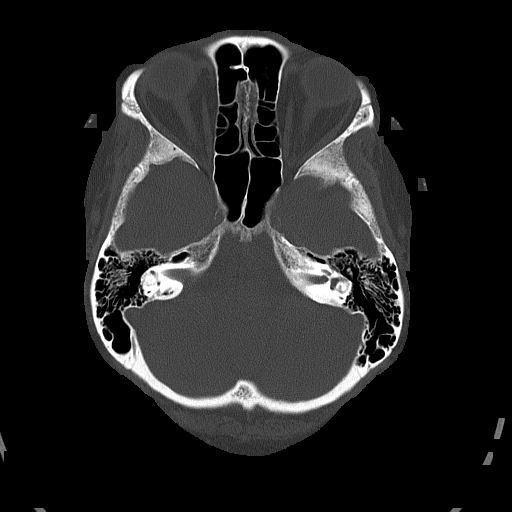
[im 26/85  bone]
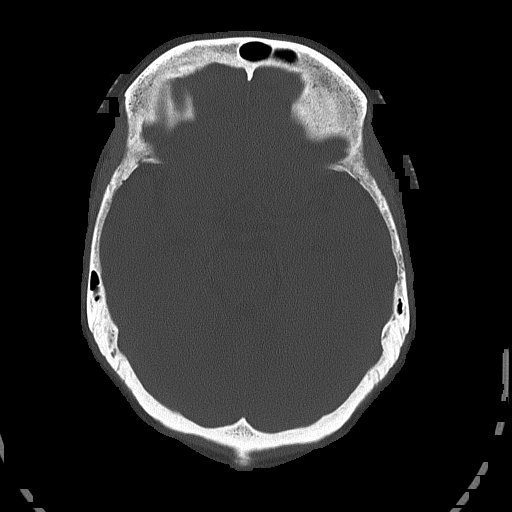

[Series 5: head 3.0 cor st · coronal · 0.34mm/px · 3 of 68 slices shown]
[im 23/68  brain]
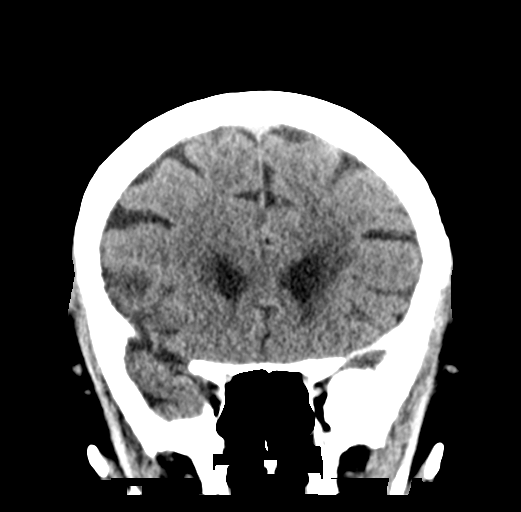
[im 30/68  brain]
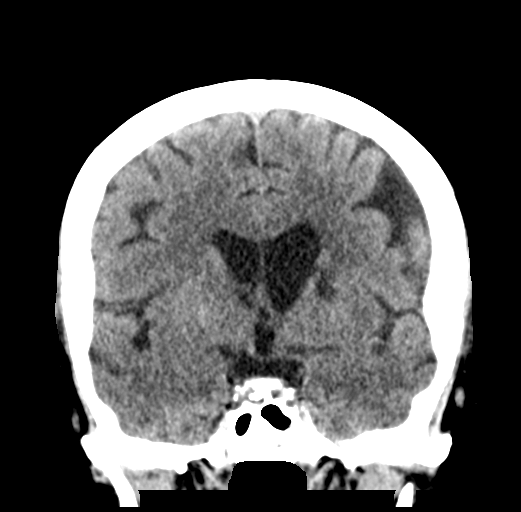
[im 38/68  brain]
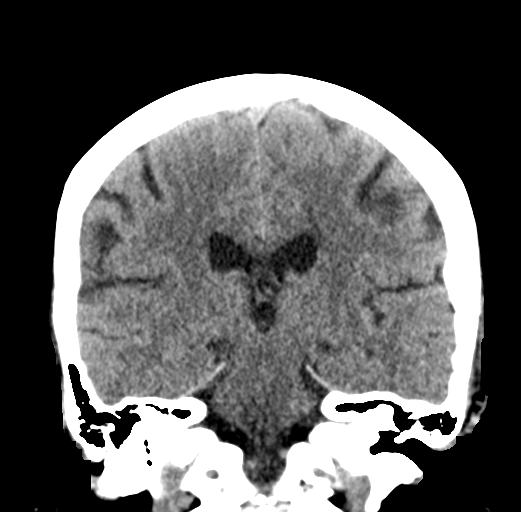

[Series 6: head 3.0 sag st · sagittal · 0.34mm/px · 3 of 58 slices shown]
[im 20/58  brain]
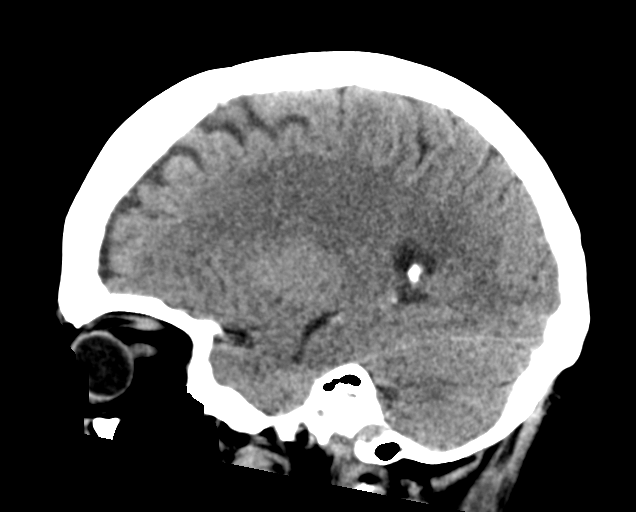
[im 29/58  brain]
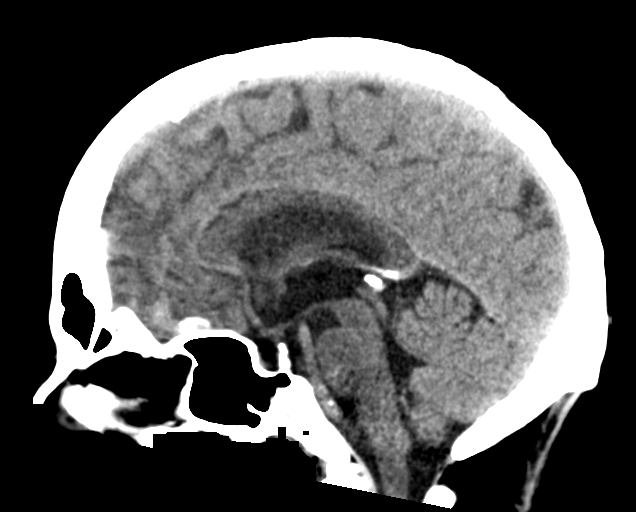
[im 39/58  brain]
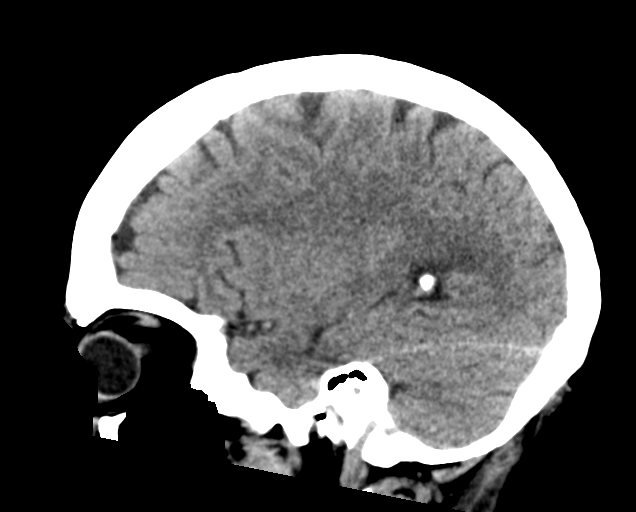

[16 of 47 positions shown; findings below may reference images not displayed]

FINDINGS: Brain: Moderate atrophy is present. Diffuse white matter disease is
asymmetric on the left. A lacunar infarct involving the anterior
limb of the left internal capsule appears remote. The basal ganglia
are otherwise intact. The insular ribbon is intact bilaterally. The
brainstem and cerebellum are normal.

No acute hemorrhage or mass lesion is present. The ventricles are
proportionate to the degree of atrophy. There is ex vacuo dilation
on the left associated with the remote lacunar infarct. No
significant extra-axial fluid collection is present.

Vascular: Calcifications are present within the cavernous internal
carotid arteries. There is no hyperdense vessel. Calcifications are
also present at dural margin of the vertebral arteries.

Skull: The calvarium is intact. No focal lytic or blastic lesions
are present.

Sinuses/Orbits: Paranasal sinuses and mastoid air cells are clear.
The globes and orbits are within normal limits.

ASPECTS (Alberta Stroke Program Early CT Score)

- Ganglionic level infarction (caudate, lentiform nuclei, internal
capsule, insula, M1-M3 cortex): [DATE]

- Supraganglionic infarction (M4-M6 cortex): [DATE]

Total score (0-10 with 10 being normal): [DATE]
IMPRESSION: 1. Remote infarct involving the anterior limb of the left internal
capsule with associated ex vacuo dilation of the left lateral
ventricle.
2. Moderate atrophy and white matter disease.
3. No acute intracranial abnormality.
4. ASPECTS is [DATE]
These results were called by telephone at the time of interpretation
on 01/22/2017 at [DATE] to Dr. LUKASHENKO , who verbally acknowledged
these results.

## 2017-08-22 DIAGNOSIS — R5381 Other malaise: Secondary | ICD-10-CM | POA: Diagnosis not present

## 2017-08-22 DIAGNOSIS — Z23 Encounter for immunization: Secondary | ICD-10-CM | POA: Diagnosis not present

## 2017-08-22 DIAGNOSIS — E1165 Type 2 diabetes mellitus with hyperglycemia: Secondary | ICD-10-CM | POA: Diagnosis not present

## 2017-08-22 DIAGNOSIS — R5383 Other fatigue: Secondary | ICD-10-CM | POA: Diagnosis not present

## 2017-08-22 DIAGNOSIS — Z79899 Other long term (current) drug therapy: Secondary | ICD-10-CM | POA: Diagnosis not present

## 2017-08-29 ENCOUNTER — Encounter (INDEPENDENT_AMBULATORY_CARE_PROVIDER_SITE_OTHER): Payer: Self-pay

## 2017-08-29 ENCOUNTER — Ambulatory Visit (INDEPENDENT_AMBULATORY_CARE_PROVIDER_SITE_OTHER): Payer: PPO | Admitting: Neurology

## 2017-08-29 ENCOUNTER — Encounter: Payer: Self-pay | Admitting: Neurology

## 2017-08-29 VITALS — BP 116/76 | HR 67 | Wt 165.2 lb

## 2017-08-29 DIAGNOSIS — G43109 Migraine with aura, not intractable, without status migrainosus: Secondary | ICD-10-CM

## 2017-08-29 DIAGNOSIS — G3184 Mild cognitive impairment, so stated: Secondary | ICD-10-CM | POA: Diagnosis not present

## 2017-08-29 DIAGNOSIS — I10 Essential (primary) hypertension: Secondary | ICD-10-CM | POA: Diagnosis not present

## 2017-08-29 DIAGNOSIS — E7800 Pure hypercholesterolemia, unspecified: Secondary | ICD-10-CM

## 2017-08-29 NOTE — Progress Notes (Signed)
STROKE NEUROLOGY FOLLOW UP NOTE  NAME: Bonnie Cook DOB: 04/01/1951  REASON FOR VISIT: stroke follow up HISTORY FROM: pt and daughter  Today we had the pleasure of seeing Bonnie Cook in follow-up at our Neurology Clinic. Pt was accompanied by daughter.   History Summary Bonnie Cook is a 66 y.o. female with history of HTN, DM, HLD and asthma admitted on 01/22/17 for expressive aphasia, nausea, brief right hand numbness followed by HA. She did not receive IV t-PA due to symptoms resolved. CT no acute abnormality and MRI negative for acute stroke but old left BG lacune. MRA, CUS, TTE unremarkable. LDL 132 and A1C 5.6. EEG showed left temporal slowing but no seizure. Recommend repeat EEG as outpt. Her symptoms resolved, but concern for complicated migraine vs. Seizure. Continued on ASA on discharge.   03/01/17 follow up - pt stated that she continued to have two more episode of HA. 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 She fell to the ground. She was sent home, had HA bifrontal lasted from 7:30pm to 11pm until she went to sleep. Second day morning, she felt good, went to work, at 8:30am, she had pounding HA, b/l temporal frontal region, denies photo and phono phobia, no N/V. Went to see her PCP, gave a shot and prescribed fioricet. She went home and slept and HA resolved after awaken at 4pm. Since then, she has had no more HA episode. She denies migraine history, but her daughter has migraine. She admitted that she has some recent stress and about to retire in 2 weeks.  Her LDL still high, but not able to tolerate statins in the past including lipitor, crestor, zocor, and pravastatin. All statins "kill her legs".    04/12/17 follow up - she has been doing well. No stroke like symptoms. She had one mild b/l temporal HA on 03/25/17 while waiting at a funeral. She took fioricet and HA went away.  Other than that, she feels good, no HA. She has not call [employer] yet for their financial program. Her ESR and CRP both normal and EEG repeat was normal too. BP today 136/76.  Interval History During the interval time, she has been doing well until 3 weeks ago she had 2 back-to-back trips which made her exhausted. Up coming back home, she had headache with nausea and feeling sick. During that time, daughter and son found patient had several episodes of confusion. She was not able to figure out how to troubleshooting why her TV did not work, how to set up alarm clock, woke up talking confused. They checked her blood pressure and sugar were normal. She went to see PCP had extensive blood work which were all negative. For the last 1 week, she felt much better and no more such episodes. BP today 116/76.  REVIEW OF SYSTEMS: Full 14 system review of systems performed and notable only for those listed below and in HPI above, all others are negative:  Constitutional:   Cardiovascular:  Ear/Nose/Throat:   Skin:  Eyes:   Respiratory:   Gastroitestinal:   Genitourinary:  Hematology/Lymphatic:   Endocrine:  Musculoskeletal:   Allergy/Immunology:   Neurological:  Headache Psychiatric:  Sleep:   The following represents the patient's updated allergies and side effects list: Allergies  Allergen Reactions  . Erythromycin Base   . Penicillins Other (See Comments)  As a child, doesn't remember reaction.      The neurologically relevant items on the patient's problem list were reviewed on today's visit.  Neurologic Examination  A problem focused neurological exam (12 or more points of the single system neurologic examination, vital signs counts as 1 point, cranial nerves count for 8 points) was performed.  Blood pressure 116/76, pulse 67, weight 165 lb 3.2 oz (74.9 kg).  General - Well nourished, well developed, in no apparent distress.  Ophthalmologic - Sharp disc margins  OU.  Cardiovascular - Regular rate and rhythm with no murmur.  Mental Status -  Level of arousal and orientation to time, place, and person were intact. Language including expression, naming, repetition, comprehension was assessed and found intact. Fund of Knowledge was assessed and was intact.  [place]  visuospatial - executive 4/5 Naming - 3/3 Memory -  Attention - 1/2, 1/1, 1/3 Language - 2/2, 0/1 Abstraction - 1/2 Delayed recall - 0/5 Orientation - 6/6 Total - 19/30  Cranial Nerves II - XII - II - Visual field intact OU. III, IV, VI - Extraocular movements intact. V - Facial sensation intact bilaterally. VII - Facial movement intact bilaterally. VIII - Hearing & vestibular intact bilaterally. X - Palate elevates symmetrically. XI - Chin turning & shoulder shrug intact bilaterally. XII - Tongue protrusion intact.  Motor Strength - The patient's strength was normal in all extremities and pronator drift was absent.  Bulk was normal and fasciculations were absent.   Motor Tone - Muscle tone was assessed at the neck and appendages and was normal.  Reflexes - The patient's reflexes were 1+ in all extremities and she had no pathological reflexes.  Sensory - Light touch, temperature/pinprick, vibration and proprioception, and Romberg testing were assessed and were normal.    Coordination - The patient had normal movements in the hands and feet with no ataxia or dysmetria.  Tremor was absent.  Gait and Station - The patient's transfers, posture, gait, station, and turns were observed as normal.  Data reviewed: I personally reviewed the images and agree with the radiology interpretations.  Ct Head Code Stroke W/o Cm 01/22/2017 1. Remote infarct involving the anterior limb of the left internal capsule with associated ex vacuo dilation of the left lateral ventricle. 2. Moderate atrophy and white matter disease. 3. No acute intracranial abnormality. 4. ASPECTS is 10/10   Mr Brain  Wo Contrast 01/23/2017 1. No acute intracranial process. 2. Remote left basal ganglia lacunar infarct. 3. Mild chronic microvascular ischemic disease.   Mr Jodene Nam Head/brain Wo Cm 01/23/2017 Normal intracranial MRA.   CUS -  Findings are consistent with a 1-39 percent stenosis involving the right internal carotid artery and the left internal carotid artery. The vertebral arteries demonstrate antegrade flow.  EEG -  This awake and asleep EEG is abnormal due to occasional focal slowing over the left temporalregion. Clinical Correlation of the above findings indicates focal cerebral dysfunction over the left temporalregion suggestive of underlying structural or physiologic abnormality. The absence of epileptiform discharges does not exclude a clinical diagnosis of epilepsy. Clinical correlation is advised.  TTE - Left ventricle: The cavity size was normal. Wall thickness was   normal. Systolic function was normal. The estimated ejection   fraction was in the range of 60% to 65%. Wall motion was normal;   there were no regional wall motion abnormalities. There was no   evidence of elevated ventricular filling pressure by Doppler   parameters. Impressions: - No cardiac source  of emboli was indentified.  EEG 03/13/17  This is a normal EEG recording in the waking state. No evidence of ictal or interictal discharges are seen.  Sleep study - no significant OSA   Component     Latest Ref Rng & Units 01/23/2017  Cholesterol     0 - 200 mg/dL 207 (H)  Triglycerides     <150 mg/dL 67  HDL Cholesterol     >40 mg/dL 62  Total CHOL/HDL Ratio     RATIO 3.3  VLDL     0 - 40 mg/dL 13  LDL (calc)     0 - 99 mg/dL 132 (H)  Hemoglobin A1C     4.8 - 5.6 % 5.6  Mean Plasma Glucose     mg/dL 114   Component     Latest Ref Rng & Units 03/01/2017  Sed Rate     0 - 40 mm/hr 2  CRP     0.0 - 4.9 mg/L 2.2   08/22/17 - A1c 5.7, B-12 905, TSH 4.03  Assessment: As you may recall, she is a 66  y.o. Caucasian female with PMH of HTN, DM, HLD and asthma admitted on 01/22/17 for expressive aphasia, nausea, brief right hand numbness followed by HA. CT no acute abnormality and MRI negative for acute stroke but old left BG lacune. MRA, CUS, TTE unremarkable. LDL 132 and A1C 5.6. EEG showed left temporal slowing but no seizure. Her symptoms resolved, but concern for complicated migraine vs. Seizure. Recommend repeat EEG as outpt. Continued on ASA on discharge. After discharge, she had three more episode of HA, one associated with left hand and left leg weakness. HA resolved after sleep or fioricet. She denies photo and phono phobia, no N/V, migraine history, but her daughter and uncle have migraine. Condition still more consistent with complicated migraine. Her LDL still high, but not able to tolerate statins in the past including lipitor, crestor, zocor, and pravastatin. Pending company financial program. ESR and CRP WNL and repeat EEG normal. For the last 3 weeks, developed confusion episode in the setting of extreme fatigue and headache, consistent with decreased brain reserve under stress. However, [place] 19/30, more significant in memory domain, will consider to monitor.  Plan:  - continue ASA for stroke prevention - pt condition more consistent with MCI. Will continue to monitor in the future. - recommend to start either tylenol, ibuprofen or fioricet as early as possible for HA abortive treatment. For fioricet, take no more than 2 a day and 5 for a week.  - call company for financial aid about the cholesterol injectable meds. - avoid fatigue, relaxation and de-stress still needed.  - follow up in 3 months with me.    I spent more than 25 minutes of face to face time with the patient. Greater than 50% of time was spent in counseling and coordination of care.    No orders of the defined types were placed in this encounter.   Meds ordered this encounter  Medications  . aspirin EC 81 MG tablet     Sig: Take by mouth.    Patient Instructions  - continue ASA for stroke prevention - your condition is most consistent with mild cognitive impairment, which is very common and most of the time stable over time and no need to worry. Will continue to monitor in the future. - recommend to start either tylenol, ibuprofen or fioricet as early as possible for HA abortive treatment. For fioricet, take no more  than 2 a day and 5 for a week.  - call company for financial aid about the cholesterol injectable meds. - avoid fatigue, relaxation and de-stress still needed.  - follow up in 3 months with me.     Rosalin Hawking, MD PhD [facility] [address], [place], [place] [address] [phone]

## 2017-08-29 NOTE — Patient Instructions (Signed)
-   continue ASA for stroke prevention - your condition is most consistent with mild cognitive impairment, which is very common and most of the time stable over time and no need to worry. Will continue to monitor in the future. - recommend to start either tylenol, ibuprofen or fioricet as early as possible for HA abortive treatment. For fioricet, take no more than 2 a day and 5 for a week.  - call company for financial aid about the cholesterol injectable meds. - avoid fatigue, relaxation and de-stress still needed.  - follow up in 3 months with me.

## 2017-09-21 ENCOUNTER — Telehealth: Payer: Self-pay | Admitting: Neurology

## 2017-09-21 NOTE — Telephone Encounter (Signed)
Kaetlyn with [facility] is asking for a call back RE:[id number] (REPATHA) 140 MG/ML SOSY for pt.  Please call [phone] HOY#[id number]

## 2017-09-21 NOTE — Telephone Encounter (Signed)
Left vm for Bonnie Cook with [facility] about the repatha.

## 2017-09-22 NOTE — Telephone Encounter (Signed)
Revised. 

## 2017-09-22 NOTE — Telephone Encounter (Signed)
Rn receive incoming call from Klahr from [facility]. Case number [id number]. Questions answer because PA expired 04/13/2017. Case pending.

## 2017-09-22 NOTE — Telephone Encounter (Addendum)
Rn receive fax from [facility]. The medication is approve from 09/22/2017 to 11/17/2017. Phone number for [facility] is  [phone], fax is [phone].

## 2017-09-26 NOTE — Telephone Encounter (Signed)
Receive fax from [facility] that medicare part d is 147.28 dollars for the repatha.

## 2017-10-13 ENCOUNTER — Ambulatory Visit: Payer: PPO | Admitting: Nurse Practitioner

## 2017-10-16 ENCOUNTER — Other Ambulatory Visit: Payer: Self-pay

## 2017-10-16 ENCOUNTER — Inpatient Hospital Stay (HOSPITAL_COMMUNITY): Payer: PPO

## 2017-10-16 ENCOUNTER — Inpatient Hospital Stay (HOSPITAL_COMMUNITY)
Admission: EM | Admit: 2017-10-16 | Discharge: 2017-10-17 | DRG: 103 | Disposition: A | Discharge status: Home / Self Care | Payer: PPO | Attending: Neurology | Admitting: Neurology

## 2017-10-16 ENCOUNTER — Encounter (HOSPITAL_COMMUNITY): Payer: Self-pay | Admitting: Emergency Medicine

## 2017-10-16 ENCOUNTER — Emergency Department (HOSPITAL_COMMUNITY): Payer: PPO

## 2017-10-16 ENCOUNTER — Telehealth: Payer: Self-pay | Admitting: Neurology

## 2017-10-16 DIAGNOSIS — G43109 Migraine with aura, not intractable, without status migrainosus: Principal | ICD-10-CM | POA: Diagnosis present

## 2017-10-16 DIAGNOSIS — Z7982 Long term (current) use of aspirin: Secondary | ICD-10-CM | POA: Diagnosis not present

## 2017-10-16 DIAGNOSIS — Z8673 Personal history of transient ischemic attack (TIA), and cerebral infarction without residual deficits: Secondary | ICD-10-CM | POA: Diagnosis not present

## 2017-10-16 DIAGNOSIS — Z7984 Long term (current) use of oral hypoglycemic drugs: Secondary | ICD-10-CM

## 2017-10-16 DIAGNOSIS — R4701 Aphasia: Secondary | ICD-10-CM | POA: Diagnosis present

## 2017-10-16 DIAGNOSIS — I639 Cerebral infarction, unspecified: Secondary | ICD-10-CM | POA: Diagnosis not present

## 2017-10-16 DIAGNOSIS — J45909 Unspecified asthma, uncomplicated: Secondary | ICD-10-CM | POA: Diagnosis not present

## 2017-10-16 DIAGNOSIS — R51 Headache: Secondary | ICD-10-CM | POA: Diagnosis not present

## 2017-10-16 DIAGNOSIS — E119 Type 2 diabetes mellitus without complications: Secondary | ICD-10-CM | POA: Diagnosis not present

## 2017-10-16 DIAGNOSIS — E7800 Pure hypercholesterolemia, unspecified: Secondary | ICD-10-CM | POA: Diagnosis present

## 2017-10-16 DIAGNOSIS — Z79899 Other long term (current) drug therapy: Secondary | ICD-10-CM | POA: Diagnosis not present

## 2017-10-16 DIAGNOSIS — I5030 Unspecified diastolic (congestive) heart failure: Secondary | ICD-10-CM | POA: Diagnosis not present

## 2017-10-16 DIAGNOSIS — E785 Hyperlipidemia, unspecified: Secondary | ICD-10-CM | POA: Diagnosis not present

## 2017-10-16 DIAGNOSIS — R29818 Other symptoms and signs involving the nervous system: Secondary | ICD-10-CM | POA: Diagnosis not present

## 2017-10-16 DIAGNOSIS — I10 Essential (primary) hypertension: Secondary | ICD-10-CM | POA: Diagnosis present

## 2017-10-16 LAB — I-STAT CHEM 8, ED
BUN: 22 mg/dL — AB (ref 6–20)
CREATININE: 1.1 mg/dL — AB (ref 0.44–1.00)
Calcium, Ion: 1.16 mmol/L (ref 1.15–1.40)
Chloride: 100 mmol/L — ABNORMAL LOW (ref 101–111)
GLUCOSE: 111 mg/dL — AB (ref 65–99)
HEMATOCRIT: 43 % (ref 36.0–46.0)
Hemoglobin: 14.6 g/dL (ref 12.0–15.0)
Potassium: 4.7 mmol/L (ref 3.5–5.1)
Sodium: 136 mmol/L (ref 135–145)
TCO2: 25 mmol/L (ref 22–32)

## 2017-10-16 LAB — COMPREHENSIVE METABOLIC PANEL
ALK PHOS: 73 U/L (ref 38–126)
ALT: 19 U/L (ref 14–54)
AST: 30 U/L (ref 15–41)
Albumin: 3.9 g/dL (ref 3.5–5.0)
Anion gap: 9 (ref 5–15)
BILIRUBIN TOTAL: 0.6 mg/dL (ref 0.3–1.2)
BUN: 20 mg/dL (ref 6–20)
CALCIUM: 9.7 mg/dL (ref 8.9–10.3)
CHLORIDE: 103 mmol/L (ref 101–111)
CO2: 24 mmol/L (ref 22–32)
CREATININE: 1.12 mg/dL — AB (ref 0.44–1.00)
GFR, EST AFRICAN AMERICAN: 57 mL/min — AB (ref >60)
GFR, EST NON AFRICAN AMERICAN: 49 mL/min — AB (ref >60)
Glucose, Bld: 108 mg/dL — ABNORMAL HIGH (ref 65–99)
Potassium: 4.6 mmol/L (ref 3.5–5.1)
Sodium: 136 mmol/L (ref 135–145)
Total Protein: 6.9 g/dL (ref 6.5–8.1)

## 2017-10-16 LAB — DIFFERENTIAL
BASOS ABS: 0 10*3/uL (ref 0.0–0.1)
Basophils Relative: 0 %
Eosinophils Absolute: 0.1 10*3/uL (ref 0.0–0.7)
Eosinophils Relative: 1 %
LYMPHS ABS: 1.1 10*3/uL (ref 0.7–4.0)
LYMPHS PCT: 11 %
MONO ABS: 0.7 10*3/uL (ref 0.1–1.0)
MONOS PCT: 7 %
NEUTROS ABS: 7.9 10*3/uL — AB (ref 1.7–7.7)
Neutrophils Relative %: 81 %

## 2017-10-16 LAB — CBC
HEMATOCRIT: 41.8 % (ref 36.0–46.0)
HEMOGLOBIN: 14.2 g/dL (ref 12.0–15.0)
MCH: 30.8 pg (ref 26.0–34.0)
MCHC: 34 g/dL (ref 30.0–36.0)
MCV: 90.7 fL (ref 78.0–100.0)
Platelets: 255 10*3/uL (ref 150–400)
RBC: 4.61 MIL/uL (ref 3.87–5.11)
RDW: 13.2 % (ref 11.5–15.5)
WBC: 9.8 10*3/uL (ref 4.0–10.5)

## 2017-10-16 LAB — I-STAT TROPONIN, ED: TROPONIN I, POC: 0.01 ng/mL (ref 0.00–0.08)

## 2017-10-16 LAB — MRSA PCR SCREENING: MRSA BY PCR: NEGATIVE

## 2017-10-16 LAB — PROTIME-INR
INR: 0.91
Prothrombin Time: 12.2 seconds (ref 11.4–15.2)

## 2017-10-16 LAB — CBG MONITORING, ED: Glucose-Capillary: 111 mg/dL — ABNORMAL HIGH (ref 65–99)

## 2017-10-16 LAB — APTT: APTT: 28 s (ref 24–36)

## 2017-10-16 MED ORDER — BUTALBITAL-APAP-CAFFEINE 50-325-40 MG PO TABS
1.0000 | ORAL_TABLET | Freq: Three times a day (TID) | ORAL | Status: DC | PRN
  Administered 2017-10-16 – 2017-10-17 (×2): 1 via ORAL
  Filled 2017-10-16 (×2): qty 1

## 2017-10-16 MED ORDER — LISINOPRIL 10 MG PO TABS
10.0000 mg | ORAL_TABLET | Freq: Every evening | ORAL | Status: DC
  Administered 2017-10-16: 10 mg via ORAL
  Filled 2017-10-16: qty 1

## 2017-10-16 MED ORDER — ONDANSETRON 4 MG PO TBDP
4.0000 mg | ORAL_TABLET | Freq: Once | ORAL | Status: DC
  Filled 2017-10-16: qty 1

## 2017-10-16 MED ORDER — ACETAMINOPHEN 160 MG/5ML PO SOLN: 650.0000 mg | ORAL | Status: DC | PRN

## 2017-10-16 MED ORDER — STROKE: EARLY STAGES OF RECOVERY BOOK
Freq: Once | Status: AC
  Administered 2017-10-16: 16:00:00
  Filled 2017-10-16: qty 1

## 2017-10-16 MED ORDER — SODIUM CHLORIDE 0.9 % IV SOLN: 50.0000 mL | Freq: Once | INTRAVENOUS | Status: DC

## 2017-10-16 MED ORDER — CYANOCOBALAMIN 1000 MCG/ML IJ SOLN
1000.0000 ug | INTRAMUSCULAR | Status: DC
  Administered 2017-10-17: 1000 ug via INTRAMUSCULAR
  Filled 2017-10-16: qty 1

## 2017-10-16 MED ORDER — NICARDIPINE HCL IN NACL 20-0.86 MG/200ML-% IV SOLN: 0.0000 mg/h | INTRAVENOUS | Status: DC

## 2017-10-16 MED ORDER — SENNOSIDES-DOCUSATE SODIUM 8.6-50 MG PO TABS: 1.0000 | ORAL_TABLET | Freq: Every evening | ORAL | Status: DC | PRN

## 2017-10-16 MED ORDER — ALTEPLASE (STROKE) FULL DOSE INFUSION
67.0000 mg | Freq: Once | INTRAVENOUS | Status: AC
  Administered 2017-10-16: 67 mg via INTRAVENOUS
  Filled 2017-10-16: qty 100

## 2017-10-16 MED ORDER — LABETALOL HCL 5 MG/ML IV SOLN: 20.0000 mg | Freq: Once | INTRAVENOUS | Status: DC

## 2017-10-16 MED ORDER — SODIUM CHLORIDE 0.9 % IV SOLN
INTRAVENOUS | Status: DC
  Administered 2017-10-16 – 2017-10-17 (×2): via INTRAVENOUS

## 2017-10-16 MED ORDER — PANTOPRAZOLE SODIUM 40 MG IV SOLR
40.0000 mg | Freq: Every day | INTRAVENOUS | Status: DC
  Administered 2017-10-16: 40 mg via INTRAVENOUS
  Filled 2017-10-16: qty 40

## 2017-10-16 MED ORDER — METFORMIN HCL 850 MG PO TABS
850.0000 mg | ORAL_TABLET | Freq: Two times a day (BID) | ORAL | Status: DC
  Administered 2017-10-17: 850 mg via ORAL
  Filled 2017-10-16 (×3): qty 1

## 2017-10-16 MED ORDER — ACETAMINOPHEN 650 MG RE SUPP: 650.0000 mg | RECTAL | Status: DC | PRN

## 2017-10-16 MED ORDER — ACETAMINOPHEN 325 MG PO TABS
650.0000 mg | ORAL_TABLET | ORAL | Status: DC | PRN
  Administered 2017-10-16: 650 mg via ORAL
  Filled 2017-10-16: qty 2

## 2017-10-16 MED ORDER — CITALOPRAM HYDROBROMIDE 10 MG PO TABS
20.0000 mg | ORAL_TABLET | Freq: Every day | ORAL | Status: DC
  Administered 2017-10-17: 20 mg via ORAL
  Filled 2017-10-16: qty 2

## 2017-10-16 NOTE — ED Notes (Signed)
Patient reports headache is improved.

## 2017-10-16 NOTE — H&P (Addendum)
History and physical      History obtained from:  Patient     HPI:                                                                                                                                         Bonnie Cook is an 66 y.o. female patient with known history of migraines.  Typical migraine can cause right sided numbness and  Aphasia.  This morning at approximately 7:30 AM patient was noted to have aphasia however family and patient thought this could be a typical migraine headache thus they did not seek any attention.  After approximately 15 minutes the symptoms resolved, however at 8:30 in the morning again the symptoms returned and due to the symptoms not resolving patient was brought to [facility].  Code stroke was called while in triage.  Patient remains to have expressive aphasia.  Patient is on able to continue reliably name objects, repeat, express herself, she is however able to follow commands without any difficulty.  Date last known well: Date: 10/16/2017 Time last known well: Time: 08:30 tPA Given: Yes NIH stroke scale  2  Modified Rankin: Rankin Score=0    Past Medical History:  Diagnosis Date  . Asthma   . Diabetes mellitus without complication ([place])   . Headache   . Hypercholesteremia   . Hypertension     Past Surgical History:  Procedure Laterality Date  . BACK SURGERY    . HAND SURGERY  2014    Family History  Problem Relation Age of Onset  . Transient ischemic attack Mother   . Alzheimer's disease Mother   . Cancer Mother   . Heart disease Father   . Stroke Maternal Grandmother   . Migraines Daughter    Social History:  reports that  has never smoked. she has never used smokeless tobacco. She reports that she does not drink alcohol or use drugs.  Allergies:  Allergies  Allergen Reactions  . Erythromycin Base   . Penicillins Other (See Comments)    As a child, doesn't remember reaction.      Medications:                                                                                                                            Current Facility-Administered Medications  Medication Dose Route Frequency  Provider Last Rate Last Dose  . alteplase (ACTIVASE) 1 mg/mL infusion 67 mg  67 mg Intravenous Once Amie Portland, MD 67 mL/hr at 10/16/17 1242 67 mg at 10/16/17 1242   Followed by  . 0.9 %  sodium chloride infusion  50 mL Intravenous Once Amie Portland, MD      . citalopram (CELEXA) tablet 20 mg  20 mg Oral Daily Marliss Coots, PA-C      . cyanocobalamin ((VITAMIN B-12)) injection 1,000 mcg  1,000 mcg Intramuscular Q30 days Marliss Coots, PA-C      . lisinopril (PRINIVIL,ZESTRIL) tablet 10 mg  10 mg Oral QPM Marliss Coots, PA-C      . metFORMIN (GLUCOPHAGE) tablet 850 mg  850 mg Oral BID Marliss Coots, PA-C      . ondansetron (ZOFRAN-ODT) disintegrating tablet 4 mg  4 mg Oral Once Deno Etienne, DO       Current Outpatient Medications  Medication Sig Dispense Refill  . albuterol (PROAIR HFA) 108 (90 Base) MCG/ACT inhaler ProAir HFA 90 mcg/actuation aerosol inhaler    . aspirin EC 81 MG tablet Take 81 mg by mouth daily.    Marland Kitchen aspirin EC 81 MG tablet Take by mouth.    Marland Kitchen BUTALBITAL-APAP-CAFF-COD PO Take by mouth.    Marland Kitchen CALCIUM PO Take 2 tablets by mouth daily.    . citalopram (CELEXA) 20 MG tablet Take 20 mg by mouth daily.    . colestipol (COLESTID) 1 g tablet Take 1 g by mouth 2 (two) times daily.    Marland Kitchen CRANBERRY PO Take 1 tablet by mouth daily.    . cyanocobalamin (,VITAMIN B-12,) 1000 MCG/ML injection Inject 1,000 mcg into the muscle every 30 (thirty) days.   1  . Evolocumab (REPATHA) 140 MG/ML SOSY Inject 140 mg into the skin every 14 (fourteen) days. 2 mL 5  . lisinopril (PRINIVIL,ZESTRIL) 10 MG tablet Take 10 mg by mouth every evening.    . loratadine (CLARITIN) 10 MG tablet Take 10 mg by mouth daily.    . meloxicam (MOBIC) 15 MG tablet Take 15 mg by mouth daily.    . metFORMIN (GLUCOPHAGE) 850 MG tablet Take 850  mg by mouth 2 (two) times daily.    . Multiple Vitamin (MULTIVITAMIN WITH MINERALS) TABS tablet Take 1 tablet by mouth daily.    . pioglitazone-metformin (ACTOPLUS MET) 15-850 MG tablet pioglitazone 15 mg-metformin 850 mg tablet    . Vitamin D, Ergocalciferol, (DRISDOL) 50000 units CAPS capsule Take 50,000 Units by mouth 2 (two) times a week. On Monday and Thursday  3     ROS:                                                                                                                                       History obtained from the patient  General ROS: negative for - chills, fatigue, fever, night sweats, weight  gain or weight loss Psychological ROS: negative for - behavioral disorder, hallucinations, memory difficulties, mood swings or suicidal ideation Ophthalmic ROS: negative for - blurry vision, double vision, eye pain or loss of vision ENT ROS: negative for - epistaxis, nasal discharge, oral lesions, sore throat, tinnitus or vertigo Allergy and Immunology ROS: negative for - hives or itchy/watery eyes Hematological and Lymphatic ROS: negative for - bleeding problems, bruising or swollen lymph nodes Endocrine ROS: negative for - galactorrhea, hair pattern changes, polydipsia/polyuria or temperature intolerance Respiratory ROS: negative for - cough, hemoptysis, shortness of breath or wheezing Cardiovascular ROS: negative for - chest pain, dyspnea on exertion, edema or irregular heartbeat Gastrointestinal ROS: negative for - abdominal pain, diarrhea, hematemesis, nausea/vomiting or stool incontinence Genito-Urinary ROS: negative for - dysuria, hematuria, incontinence or urinary frequency/urgency Musculoskeletal ROS: negative for - joint swelling or muscular weakness Neurological ROS: as noted in HPI Dermatological ROS: negative for rash and skin lesion changes  Neurologic Examination:                                                                                                       Blood pressure 111/86, pulse 80, temperature 97.6 F (36.4 C), resp. rate (!) 21, height _0  (1.575 m), weight 75.1 kg (165 lb 9.1 oz), SpO2 100 %.  HEENT-  Normocephalic, no lesions, without obvious abnormality.  Normal external eye and conjunctiva.  Normal TM's bilaterally.  Normal auditory canals and external ears. Normal external nose, mucus membranes and septum.  Normal pharynx. Cardiovascular- S1, S2 normal, pulses palpable throughout   Lungs- chest clear, no wheezing, rales, normal symmetric air entry Abdomen- normal findings: bowel sounds normal Extremities- no edema Lymph-no adenopathy palpable Musculoskeletal-no joint tenderness, deformity or swelling Skin-warm and dry, no hyperpigmentation, vitiligo, or suspicious lesions  Neurological Examination Mental Status: Alert, oriented, thought content appropriate.  Speech fluent with clear evidence of expressive aphasia.  Able to follow 3 step commands without difficulty. Cranial Nerves: II: Discs flat bilaterally; Visual fields grossly normal,  III,IV, VI: ptosis not present, extra-ocular motions intact bilaterally, pupils equal, round, reactive to light and accommodation V,VII: smile symmetric, facial light touch sensation normal bilaterally VIII: hearing normal bilaterally IX,X: uvula rises symmetrically XI: bilateral shoulder shrug XII: midline tongue extension Motor: Right : Upper extremity   5/5    Left:     Upper extremity   5/5  Lower extremity   5/5     Lower extremity   5/5 Tone and bulk:normal tone throughout; no atrophy noted Sensory: Pinprick and light touch intact throughout, bilaterally Deep Tendon Reflexes: 2+ and symmetric throughout Plantars: Right: downgoing   Left: downgoing Cerebellar: normal finger-to-nose, normal rapid alternating movements and normal heel-to-shin test Gait: normal gait and station       Lab Results: Basic Metabolic Panel: Recent Labs  Lab 10/16/17 1215  NA 136  K 4.7  CL  100*  GLUCOSE 111*  BUN 22*  CREATININE 1.10*    Liver Function Tests: No results for input(s): AST, ALT, ALKPHOS, BILITOT, PROT, ALBUMIN in the last 168 hours. No results  for input(s): LIPASE, AMYLASE in the last 168 hours. No results for input(s): AMMONIA in the last 168 hours.  CBC: Recent Labs  Lab 10/16/17 1208 10/16/17 1215  WBC 9.8  --   NEUTROABS 7.9*  --   HGB 14.2 14.6  HCT 41.8 43.0  MCV 90.7  --   PLT 255  --     Cardiac Enzymes: No results for input(s): CKTOTAL, CKMB, CKMBINDEX, TROPONINI in the last 168 hours.  Lipid Panel: No results for input(s): CHOL, TRIG, HDL, CHOLHDL, VLDL, LDLCALC in the last 168 hours.  CBG: No results for input(s): GLUCAP in the last 168 hours.  Microbiology: No results found for this or any previous visit.  Coagulation Studies: Recent Labs    10/16/17 12/30/1206  LABPROT 12.2  INR 0.91    Imaging: Ct Head Code Stroke Wo Contrast  Result Date: 10/16/2017 CLINICAL DATA:  Code stroke.  Slurred speech.  Aphasia EXAM: CT HEAD WITHOUT CONTRAST TECHNIQUE: Contiguous axial images were obtained from the base of the skull through the vertex without intravenous contrast. COMPARISON:  MRI 01/23/2017 FINDINGS: Brain: Chronic ischemic changes in the white matter, left greater than right. Chronic ischemia in the internal capsule on the left is unchanged. Negative for acute infarct, hemorrhage, mass. Vascular: Negative for hyperdense vessel Skull: Negative Sinuses/Orbits: Negative Other: None ASPECTS (Jeannette Stroke Program Early CT Score) - Ganglionic level infarction (caudate, lentiform nuclei, internal capsule, insula, M1-M3 cortex): 7 - Supraganglionic infarction (M4-M6 cortex): 3 Total score (0-10 with 10 being normal): 10 IMPRESSION: 1. No acute intracranial abnormality. Chronic microvascular ischemic changes, left greater than right. 2. ASPECTS is 10 Electronically Signed   By: Franchot Gallo M.D.   On: 10/16/2017 12:28       Assessment  and plan discussed with with attending physician and they are in agreement.    Etta Quill PA-C [facility] [phone]  10/16/2017, 12:46 PM   Assessment: 66 y.o. female with known history of migraine headaches that consist of right-sided numbness and expressive aphasia at times.  However, this episode lasted much longer than usual and due to the fear that she may be having a stroke patient was brought to the hospital.  At that time code stroke was called.  A clear conversation was had with the patient's daughter about the disabling issue of expressive aphasia and the concern that she may be having a stroke although she had 1 previous similar episode thought to be from migraine.   The 3% risk of intracranial bleed was discussed and understood by patient's daughter.  At that time patient's daughter decided to go ahead and administer TPA.  Stroke Risk Factors - diabetes mellitus, hyperlipidemia and hypertension  Recommend 1. HgbA1c, fasting lipid panel 2. MRI of the brain without contrast, along with CTA of head and neck 3. PT consult, OT consult, Speech consult 4. Echocardiogram 5. 80 mg of Atorvistatin 6. Prophylactic therapy-Antiplatelet med: No antiplatelet until 24 hours after TPA 7. Risk factor modification 8. Telemetry monitoring 9. Frequent neuro checks 10 NPO until passes stroke swallow screen 11 please page stroke NP  Or  PA  Or MD from 8am -4 pm  as this patient from this time will be  followed by the stroke.   You can look them up on [email or web address]  Password [email or web address]   NEUROHOSPITALIST ADDENDUM Seen and examined the patient this AM. Formulated plan as documented above.  66 year old female multiple vascular risk factors and prior old lacunar stroke presents with expressive aphasia lasting  for about 4 hours. Symptoms began at 7:30 AM and completely resolved by 8 AM only to reoccur at 8:30 AM according to the daughter. Confirmed complete resolution of symptoms at 8:00 and  recurrence of 8.30 a.m. Expressive aphasia seemed disabling as patient was unable to read and have a conversation.  She does carry a previous diagnosis of complicated migraine presenting with similar symptoms but was much more transient and TIA was still a possibility. She has had other migraines as well with left-sided symptoms. She has never had persistent aphasia but she presented this time with. Given her multiple vascular risk factors  her symptoms could definitely be a stroke and we decided to administer TPA after explaining the risk versus benefit. An MRI subsequently showed no evidence of stroke despite her having symptoms. An EEG was also performed to see if these seizures-report is still pending. Likely her episode is a complicated migraine.   I spent 50 min taking care of this neurologically critical ill patient and discuss risks of TPA administration including symptomatic intracranial hemorrhage resulting in death and disability.   Karena Addison Lexiana Spindel MD [facility] [phone]  If 7pm to 7am, please call on call as listed on AMION.

## 2017-10-16 NOTE — Code Documentation (Signed)
66yo female arriving to [facility] via private vehicle at 1150.  Patient from home where she experienced confusion and difficulty putting on her makeup around 45.  Her daughter came over and noted her to have slurred speech and aphasia at 0730.  Of note, patient with h/o episode of headache and aphasia in March 2018 in which she was diagnosed with complicated migraine. Code stroke called on patient arrival to the ED.  Patient to CT.  Stroke team to the bedside.  CT completed.  NIHSS 3, see documentation for details and code stroke times.  Patient unable to accurately answer LOC questions and has moderate expressive aphasia on exam.  Patient with naming intact, however, has multiple paraphasic errors on exam.  Patient c/o 2/10 headache.  Patient to Trauma A.  Patient's daughter at the bedside and reports that aphasia resolved completely, and patient was feeling well and at her baseline at 0800.  Her daughter was about to leave around 0830 when patient experienced a second episode of aphasia, and her daughter brought her to the ED.  Dr. Lorraine Lax notified and to the bedside.  After further discussion with patient's daughter, decision was made to treat with tPA.  BP within tPA parameters.  2nd PIV placed.  Pharmacy notified and to the bedside to mix tPA.  7mg  tPA bolus given over 1 minute at 1242 followed by 60mg /hr for a total of 67mg  per pharmacy dosing.  Patient monitored frequently per post-tPA protocol.  Patient with worsening headache now 4/10.  Dr. Lorraine Lax made aware, no new orders.  MD request for STAT MRI.  MRI notified and patient transported to MRI.  Dr. Lorraine Lax made aware that RN would not be able to complete neuro assessments while patient in the scanner, order to proceed.  tPA infusion put on MRI compatible pump.  NS bag hung prior to MRI scan.  Vital signs monitored frequently while patient in the scanner.  Dr. Lorraine Lax to the bedside, MRI DWI negative per MD.  Patient tolerated imaging.  Patient reassessed following  MRI completion.  Headache now 1/10.  Patient transferred to 4N31.  Patient with improved aphasia on exam.  Bedside handoff with 4N RN Marlowe Kays.

## 2017-10-16 NOTE — Progress Notes (Signed)
EEG completed, results pending. 

## 2017-10-16 NOTE — Telephone Encounter (Addendum)
Rn spoke with patients daughter about her mom having migraine, nausea and speech issues.Pts daughter stated her mom call her around 730 or 0700 this am complaining of nausea, migraine and having speech problems. Pts daughter who is a nurse stated the last time this happen pt went to hospital,and had a stroke work up. The last time it was label as a complicated migraine not a stroke. PTs daughter saw her mom and she took butalbital and it cleared up the migraine and the nausea feeling. She stated her moms speech is not getting better. She made her mom write something,and she was unable to do it. Rn recommend she still seek the ED for evaluation. Rn advised the daughter that Dr. Erlinda Hong only does stroke issues not migraine when pts are admitted. Rn stated a message will be sent to daughter she verbalized understanding.

## 2017-10-16 NOTE — Telephone Encounter (Signed)
Patient's daughter is calling. Patient is complaining of a migraine today and it is affecting her speech much worse since the last time she had. She is very nauseated too. She is not having any signs of stroke except the speech problem which she had before. I discussed her taking the patient to the ED but daughter said last time she took her to the ED with same symptoms it was a migraine. I advised Dr. Erlinda Hong is not in the office but will send to his nurse.

## 2017-10-16 NOTE — Progress Notes (Signed)
Pharmacist Code Stroke Response  Notified to mix tPA at 12:35 by Dr. Lorraine Lax Delivered tPA to RN at 12:40  Issues encountered: None  Yazleemar Strassner, Rande Lawman 10/16/17 12:43 PM

## 2017-10-16 NOTE — ED Notes (Signed)
Normal Saline started.

## 2017-10-16 NOTE — Telephone Encounter (Signed)
Revised. 

## 2017-10-16 NOTE — ED Notes (Signed)
Patient in MRI 

## 2017-10-16 NOTE — ED Notes (Signed)
Neuro made aware patient headache increasingly worse.

## 2017-10-16 NOTE — ED Notes (Signed)
TPA restarted at this time.

## 2017-10-16 NOTE — Telephone Encounter (Signed)
Noted. Pt received tPA here and stat MRI no acute stroke. Still more consistent with complicated migraine. Will repeat EEG this time again. Thanks.   Rosalin Hawking, MD PhD Stroke Neurology 10/16/2017 2:01 PM

## 2017-10-16 NOTE — ED Triage Notes (Signed)
Pt here with daughter stating "something going on in head" at 0700 and speech slurring at 0730.,  Daughter states pt has hx of migraines with slurred speech and aphasia.  However, previously, hand tingling and speech difficulties are only brief.  However, pt continues to have slurred speech and aphasia.  Grips equal.

## 2017-10-16 NOTE — ED Notes (Signed)
Patient in scanner.

## 2017-10-16 NOTE — ED Notes (Signed)
Belongings with daughter in law.

## 2017-10-16 NOTE — ED Provider Notes (Signed)
[facility] EMERGENCY DEPARTMENT Provider Note   CSN: [id number] Arrival date & time: 10/16/17  1150     History   Chief Complaint Chief Complaint  Patient presents with  . Aphasia  . Headache  . Code Stroke    HPI Bonnie Cook is a 66 y.o. female.  51 y oF with a chief complaint of aphasia and a headache.  The patient has had a history of a comp located migraine in the past.  She started having symptoms early this morning but resolved.  They recurred about 8 AM.  She ended up having continued aphasia.  She never had this prior with her complex migraines.  Was then brought to the emergency department.  She arrived as a code stroke.  Was taken rapidly back to the CT scanner.  Upon my discussion with the patient her symptoms had mildly improved.   The history is provided by the patient and a relative.  Headache   This is a recurrent problem. The current episode started 1 to 2 hours ago. The problem occurs constantly. The problem has not changed since onset.The headache is associated with nothing. The quality of the pain is described as dull. The pain is at a severity of 8/10. The pain is moderate. The pain does not radiate. Pertinent negatives include no fever, no palpitations, no shortness of breath, no nausea and no vomiting. She has tried nothing for the symptoms. The treatment provided no relief.    Past Medical History:  Diagnosis Date  . Asthma   . Diabetes mellitus without complication ([place])   . Headache   . Hypercholesteremia   . Hypertension     Patient Active Problem List   Diagnosis Date Noted  . Stroke (cerebrum) ([place]) 10/16/2017  . MCI (mild cognitive impairment) 08/29/2017  . Abnormal EEG 03/02/2017  . Complicated migraine 65/99/3570  . Headache 03/01/2017  . Essential hypertension   . Pure hypercholesterolemia   . Diabetes mellitus without complication ([place])   . Word finding difficulty   . TIA (transient ischemic attack) 01/22/2017     Past Surgical History:  Procedure Laterality Date  . BACK SURGERY    . HAND SURGERY  2014    OB History    No data available       Home Medications    Prior to Admission medications   Medication Sig Start Date End Date Taking? Authorizing Provider  albuterol (PROAIR HFA) 108 (90 Base) MCG/ACT inhaler ProAir HFA 90 mcg/actuation aerosol inhaler    [provider]  aspirin EC 81 MG tablet Take 81 mg by mouth daily.    [provider]  aspirin EC 81 MG tablet Take by mouth.    [provider]  BUTALBITAL-APAP-CAFF-COD PO Take by mouth.    [provider]  CALCIUM PO Take 2 tablets by mouth daily.    [provider]  citalopram (CELEXA) 20 MG tablet Take 20 mg by mouth daily. 01/18/17   [provider]  colestipol (COLESTID) 1 g tablet Take 1 g by mouth 2 (two) times daily. 01/18/17   [provider]  CRANBERRY PO Take 1 tablet by mouth daily.    [provider]  cyanocobalamin (,VITAMIN B-12,) 1000 MCG/ML injection Inject 1,000 mcg into the muscle every 30 (thirty) days.  01/18/17   [provider]  Evolocumab (REPATHA) 140 MG/ML SOSY Inject 140 mg into the skin every 14 (fourteen) days. 03/02/17   Rosalin Hawking, MD  lisinopril ([place])  10 MG tablet Take 10 mg by mouth every evening. 01/18/17   [provider]  loratadine (CLARITIN) 10 MG tablet Take 10 mg by mouth daily.    [provider]  meloxicam (MOBIC) 15 MG tablet Take 15 mg by mouth daily. 01/03/17   [provider]  metFORMIN (GLUCOPHAGE) 850 MG tablet Take 850 mg by mouth 2 (two) times daily. 01/18/17   [provider]  Multiple Vitamin (MULTIVITAMIN WITH MINERALS) TABS tablet Take 1 tablet by mouth daily.    [provider]  pioglitazone-metformin (ACTOPLUS MET) 15-850 MG tablet pioglitazone 15 mg-metformin 850 mg tablet    [provider]  Vitamin D, Ergocalciferol, (DRISDOL) 50000 units  CAPS capsule Take 50,000 Units by mouth 2 (two) times a week. On Monday and Thursday 01/18/17   [provider]    Family History Family History  Problem Relation Age of Onset  . Transient ischemic attack Mother   . Alzheimer's disease Mother   . Cancer Mother   . Heart disease Father   . Stroke Maternal Grandmother   . Migraines Daughter     Social History Social History   Tobacco Use  . Smoking status: Never Smoker  . Smokeless tobacco: Never Used  Substance Use Topics  . Alcohol use: No  . Drug use: No     Allergies   Erythromycin base and Penicillins   Review of Systems Review of Systems  Constitutional: Negative for chills and fever.  HENT: Negative for congestion and rhinorrhea.   Eyes: Negative for redness and visual disturbance.  Respiratory: Negative for shortness of breath and wheezing.   Cardiovascular: Negative for chest pain and palpitations.  Gastrointestinal: Negative for nausea and vomiting.  Genitourinary: Negative for dysuria and urgency.  Musculoskeletal: Negative for arthralgias and myalgias.  Skin: Negative for pallor and wound.  Neurological: Positive for facial asymmetry, speech difficulty and headaches. Negative for dizziness.     Physical Exam Updated Vital Signs BP (!) 150/79   Pulse 73   Temp (!) 97.5 F (36.4 C)   Resp 14   Ht _0  (1.575 m)   Wt 75.1 kg (165 lb 9.1 oz)   SpO2 100%   BMI 30.28 kg/m   Physical Exam  Constitutional: She is oriented to person, place, and time. She appears well-developed and well-nourished. No distress.  HENT:  Head: Normocephalic and atraumatic.  Eyes: EOM are normal. Pupils are equal, round, and reactive to light.  Neck: Normal range of motion. Neck supple.  Cardiovascular: Normal rate and regular rhythm. Exam reveals no gallop and no friction rub.  No murmur heard. Pulmonary/Chest: Effort normal. She has no wheezes. She has no rales.  Abdominal: Soft. She exhibits no distension.  There is no tenderness.  Musculoskeletal: She exhibits no edema or tenderness.  Neurological: She is alert and oriented to person, place, and time.  Some difficulty with word finding  Skin: Skin is warm and dry. She is not diaphoretic.  Psychiatric: She has a normal mood and affect. Her behavior is normal.  Nursing note and vitals reviewed.    ED Treatments / Results  Labs (all labs ordered are listed, but only abnormal results are displayed) Labs Reviewed  DIFFERENTIAL - Abnormal; Notable for the following components:      Result Value   Neutro Abs 7.9 (*)    All other components within normal limits  COMPREHENSIVE METABOLIC PANEL - Abnormal; Notable for the following components:   Glucose, Bld 108 (*)  Creatinine, Ser 1.12 (*)    GFR calc non Af Amer 49 (*)    GFR calc Af Amer 57 (*)    All other components within normal limits  I-STAT CHEM 8, ED - Abnormal; Notable for the following components:   Chloride 100 (*)    BUN 22 (*)    Creatinine, Ser 1.10 (*)    Glucose, Bld 111 (*)    All other components within normal limits  PROTIME-INR  APTT  CBC  I-STAT TROPONIN, ED  CBG MONITORING, ED    EKG  EKG Interpretation  Date/Time:  Monday October 16 2017 12:31:47 EST Ventricular Rate:  76 PR Interval:    QRS Duration: 81 QT Interval:  376 QTC Calculation: 423 R Axis:   -6 Text Interpretation:  Sinus rhythm Ventricular premature complex No significant change since last tracing Confirmed by Deno Etienne [phone]) on 10/16/2017 12:37:18 PM       Radiology Ct Head Code Stroke Wo Contrast  Result Date: 10/16/2017 CLINICAL DATA:  Code stroke.  Slurred speech.  Aphasia EXAM: CT HEAD WITHOUT CONTRAST TECHNIQUE: Contiguous axial images were obtained from the base of the skull through the vertex without intravenous contrast. COMPARISON:  MRI 01/23/2017 FINDINGS: Brain: Chronic ischemic changes in the white matter, left greater than right. Chronic ischemia in the internal  capsule on the left is unchanged. Negative for acute infarct, hemorrhage, mass. Vascular: Negative for hyperdense vessel Skull: Negative Sinuses/Orbits: Negative Other: None ASPECTS (Fleischmanns Stroke Program Early CT Score) - Ganglionic level infarction (caudate, lentiform nuclei, internal capsule, insula, M1-M3 cortex): 7 - Supraganglionic infarction (M4-M6 cortex): 3 Total score (0-10 with 10 being normal): 10 IMPRESSION: 1. No acute intracranial abnormality. Chronic microvascular ischemic changes, left greater than right. 2. ASPECTS is 10 Electronically Signed   By: Franchot Gallo M.D.   On: 10/16/2017 12:28    Procedures Procedures (including critical care time)  Medications Ordered in ED Medications  ondansetron (ZOFRAN-ODT) disintegrating tablet 4 mg (not administered)  alteplase (ACTIVASE) 1 mg/mL infusion 67 mg (67 mg Intravenous New Bag/Given 10/16/17 1242)    Followed by  0.9 %  sodium chloride infusion (not administered)  citalopram (CELEXA) tablet 20 mg (not administered)  cyanocobalamin ((VITAMIN B-12)) injection 1,000 mcg (not administered)  lisinopril (PRINIVIL,ZESTRIL) tablet 10 mg (not administered)  metFORMIN (GLUCOPHAGE) tablet 850 mg (not administered)     Initial Impression / Assessment and Plan / ED Course  I have reviewed the triage vital signs and the nursing notes.  Pertinent labs & imaging results that were available during my care of the patient were reviewed by me and considered in my medical decision making (see chart for details).     66 yo F with a chief complaint of acute onset aphasia.  Due to the timing of the symptoms code stroke was initiated.  Decision was made between the neurologist and the family to give IV TPA.  Admit to the ICU.  CRITICAL CARE Performed by: Cecilio Asper   Total critical care time: 35 minutes  Critical care time was exclusive of separately billable procedures and treating other patients.  Critical care was necessary to  treat or prevent imminent or life-threatening deterioration.  Critical care was time spent personally by me on the following activities: development of treatment plan with patient and/or surrogate as well as nursing, discussions with consultants, evaluation of patient's response to treatment, examination of patient, obtaining history from patient or surrogate, ordering and performing treatments and interventions, ordering and review  of laboratory studies, ordering and review of radiographic studies, pulse oximetry and re-evaluation of patient's condition.   The patients results and plan were reviewed and discussed.   Any x-rays performed were independently reviewed by myself.   Differential diagnosis were considered with the presenting HPI.  Medications  ondansetron (ZOFRAN-ODT) disintegrating tablet 4 mg (not administered)  alteplase (ACTIVASE) 1 mg/mL infusion 67 mg (67 mg Intravenous New Bag/Given 10/16/17 1242)    Followed by  0.9 %  sodium chloride infusion (not administered)  citalopram (CELEXA) tablet 20 mg (not administered)  cyanocobalamin ((VITAMIN B-12)) injection 1,000 mcg (not administered)  lisinopril (PRINIVIL,ZESTRIL) tablet 10 mg (not administered)  metFORMIN (GLUCOPHAGE) tablet 850 mg (not administered)    Vitals:   10/16/17 1247 10/16/17 1249 10/16/17 1251 10/16/17 1255  BP:  (!) 156/81 (!) 156/81 (!) 150/79  Pulse: 72 79 77 73  Resp: (!) _0 Temp:  (!) 97.5 F (36.4 C)    TempSrc:      SpO2: 93% 99% 100% 100%  Weight:      Height:        Final diagnoses:  Stroke, acute, embolic ([place])    Admission/ observation were discussed with the admitting physician, patient and/or family and they are comfortable with the plan.    Final Clinical Impressions(s) / ED Diagnoses   Final diagnoses:  Stroke, acute, embolic [facility])    ED Discharge Orders    None       Deno Etienne, DO 10/16/17 1302

## 2017-10-16 NOTE — ED Notes (Signed)
TPA paused for tubing changed to for MRI

## 2017-10-17 ENCOUNTER — Inpatient Hospital Stay (HOSPITAL_COMMUNITY): Payer: PPO

## 2017-10-17 DIAGNOSIS — E119 Type 2 diabetes mellitus without complications: Secondary | ICD-10-CM

## 2017-10-17 DIAGNOSIS — I5030 Unspecified diastolic (congestive) heart failure: Secondary | ICD-10-CM

## 2017-10-17 DIAGNOSIS — E785 Hyperlipidemia, unspecified: Secondary | ICD-10-CM

## 2017-10-17 DIAGNOSIS — I639 Cerebral infarction, unspecified: Secondary | ICD-10-CM

## 2017-10-17 DIAGNOSIS — I10 Essential (primary) hypertension: Secondary | ICD-10-CM

## 2017-10-17 DIAGNOSIS — G43109 Migraine with aura, not intractable, without status migrainosus: Principal | ICD-10-CM

## 2017-10-17 LAB — LIPID PANEL
CHOLESTEROL: 186 mg/dL (ref 0–200)
HDL: 70 mg/dL (ref >40)
LDL CALC: 101 mg/dL — AB (ref 0–99)
TRIGLYCERIDES: 75 mg/dL (ref <150)
Total CHOL/HDL Ratio: 2.7 RATIO
VLDL: 15 mg/dL (ref 0–40)

## 2017-10-17 LAB — HEMOGLOBIN A1C
HEMOGLOBIN A1C: 5.7 % — AB (ref 4.8–5.6)
Mean Plasma Glucose: 116.89 mg/dL

## 2017-10-17 LAB — ECHOCARDIOGRAM COMPLETE
Height: 62 in
Weight: 2649.05 oz

## 2017-10-17 LAB — GLUCOSE, CAPILLARY: Glucose-Capillary: 207 mg/dL — ABNORMAL HIGH (ref 65–99)

## 2017-10-17 MED ORDER — PANTOPRAZOLE SODIUM 40 MG PO TBEC: 40.0000 mg | DELAYED_RELEASE_TABLET | Freq: Every day | ORAL | Status: DC

## 2017-10-17 MED ORDER — DIVALPROEX SODIUM ER 500 MG PO TB24: 500.0000 mg | ORAL_TABLET | Freq: Every day | ORAL | 3 refills | Status: DC

## 2017-10-17 NOTE — Progress Notes (Signed)
OT Cancellation Note  Patient Details Name: Bonnie Cook MRN: [id number] DOB: 22-May-1951   Cancelled Treatment:    Reason Eval/Treat Not Completed: Other (comment). Pt on strict bedrest. Please update activity orders when appropriate for therapy. Thanks  [place], OT/L  [phone] 10/17/2017 10/17/2017, 7:14 AM

## 2017-10-17 NOTE — Procedures (Signed)
ELECTROENCEPHALOGRAM REPORT  Date of Study: 10/16/2017  Patient's Name: Bonnie Cook MRN: [id number] Date of Birth: Feb 09, 1951  Referring Provider: Rosalin Hawking, MD  Clinical History: 66 year old woman with complicated migraines presenting as right sided numbness and aphasia presents to hospital with habitual symptoms that have not resolved.  Medications: acetaminophen (TYLENOL)  citalopram (CELEXA)  cyanocobalamin ((VITAMIN B-12))   labetalol (NORMODYNE,TRANDATE) lisinopril (PRINIVIL,ZESTRIL)  metFORMIN (GLUCOPHAGE)  nicardipine (CARDENE) ondansetron (ZOFRAN-ODT)  pantoprazole (PROTONIX) senna-docusate (Senokot-S)  Technical Summary: A multichannel digital EEG recording measured by the international 10-20 system with electrodes applied with paste and impedances below 5000 ohms performed as portable with EKG monitoring in an awake and drowsy patient.  Hyperventilation and photic stimulation were not performed.  The digital EEG was referentially recorded, reformatted, and digitally filtered in a variety of bipolar and referential montages for optimal display.   Description: The patient is awake and drowsy during the recording.  During maximal wakefulness, there is a symmetric, medium voltage 7 Hz posterior dominant rhythm that attenuates with eye opening. This is admixed with diffuse 4-5 Hz theta and 2-3 Hz delta slowing of the waking background.  There is superimposed 2-3 Hz delta slowing in the left hemisphere.  Stage 2 sleep is not seen.  There were no epileptiform discharges or electrographic seizures seen.    EKG lead was unremarkable.  Impression: This awake and drowsy EEG is abnormal due to: 1. diffuse slowing of the waking background. 2.  Superimposed left hemispheric slowing  Clinical Correlation of the above findings indicates: 1. diffuse cerebral dysfunction that is non-specific in etiology and can be seen with hypoxic/ischemic injury, toxic/metabolic encephalopathies,  neurodegenerative disorders, or medication effect.   2.  Structural or physiologic abnormality in the left hemisphere.  The absence of epileptiform discharges does not rule out a clinical diagnosis of epilepsy.  Clinical correlation is advised.   Metta Clines, DO

## 2017-10-17 NOTE — Progress Notes (Signed)
Discharge paperwork reviewed with patient and family at bedside. IV removed. Patient taken outside via wheelchair. Colandra Ohanian, Rande Brunt, RN

## 2017-10-17 NOTE — Progress Notes (Signed)
OT Screen    10/17/17 1500  OT Visit Information  Last OT Received On 10/17/17  Reason Eval/Treat Not Completed OT screened, no needs identified, will sign off. Per RN and PT, pt back at baseline; MRI/CT results negative. Talked with pt, and she states she is feeling back to baseline. Educated pt and family on BEFAST for stroke signs and symptoms. Pt very thankful for information. All acute OT needs met and will sign off. Thank you.    [place], OTR/L Acute Rehab Pager: [phone] Office: [phone]

## 2017-10-17 NOTE — Discharge Instructions (Addendum)
Please resume all your home medications We added depakote ER for migraine prevention. Please take one tab (500mg ) daily at night.  If you have experienced the stroke like symptoms again, we still recommend to call 911 or go to the nearest ER for evaluation.  Keep the appointment with Dr. Erlinda Hong on 12/04/17.     Migraine Headache A migraine headache is a very strong throbbing pain on one side or both sides of your head. Migraines can also cause other symptoms. Talk with your doctor about what things may bring on (trigger) your migraine headaches. Follow these instructions at home: Medicines  Take over-the-counter and prescription medicines only as told by your doctor.  Do not drive or use heavy machinery while taking prescription pain medicine.  To prevent or treat constipation while you are taking prescription pain medicine, your doctor may recommend that you: ? Drink enough fluid to keep your pee (urine) clear or pale yellow. ? Take over-the-counter or prescription medicines. ? Eat foods that are high in fiber. These include fresh fruits and vegetables, whole grains, and beans. ? Limit foods that are high in fat and processed sugars. These include fried and sweet foods. Lifestyle  Avoid alcohol.  Do not use any products that contain nicotine or tobacco, such as cigarettes and e-cigarettes. If you need help quitting, ask your doctor.  Get at least 8 hours of sleep every night.  Limit your stress. General instructions   Keep a journal to find out what may bring on your migraines. For example, write down: ? What you eat and drink. ? How much sleep you get. ? Any change in what you eat or drink. ? Any change in your medicines.  If you have a migraine: ? Avoid things that make your symptoms worse, such as bright lights. ? It may help to lie down in a dark, quiet room. ? Do not drive or use heavy machinery. ? Ask your doctor what activities are safe for you.  Keep all follow-up  visits as told by your doctor. This is important. Contact a doctor if:  You get a migraine that is different or worse than your usual migraines. Get help right away if:  Your migraine gets very bad.  You have a fever.  You have a stiff neck.  You have trouble seeing.  Your muscles feel weak or like you cannot control them.  You start to lose your balance a lot.  You start to have trouble walking.  You pass out (faint). This information is not intended to replace advice given to you by your health care provider. Make sure you discuss any questions you have with your health care provider. Document Released: 08/09/2008 Document Revised: 05/20/2016 Document Reviewed: 04/18/2016 Elsevier Interactive Patient Education  2017 [employer].

## 2017-10-17 NOTE — Progress Notes (Signed)
PT Cancellation Note  Patient Details Name: Bonnie Cook MRN: [id number] DOB: 25-Nov-1950   Cancelled Treatment:    Reason Eval/Treat Not Completed: Medical issues which prohibited therapy(pt on strict bedrest s/p tPA and await increased activity order)   Lonzell Dorris B Ercia Crisafulli 10/17/2017, 7:17 AM Elwyn Reach, [place]

## 2017-10-17 NOTE — Progress Notes (Signed)
Continue to have issues with blood pressure cuff not reading even after repositioning. Finally after changing the cuff, I was able to get a BP recorded.  The 0300 BP will not be on file for tpa charting due to this issue

## 2017-10-17 NOTE — Discharge Summary (Signed)
Stroke Discharge Summary  Patient ID: Bonnie Cook   MRN: [id number]      DOB: 04/30/1951  Date of Admission: 10/16/2017 Date of Discharge: 10/17/2017  Attending Physician:  Rosalin Hawking, MD, Stroke MD Consultant(s):    None  Patient's PCP:  Myrlene Broker, MD  DISCHARGE DIAGNOSIS:  Complicated migraine s/p tPA for stroke mimic  Active Problems:   HTN   DM   HLD   asthma   Past Medical History:  Diagnosis Date  . Asthma   . Diabetes mellitus without complication ([place])   . Headache   . Hypercholesteremia   . Hypertension    Past Surgical History:  Procedure Laterality Date  . BACK SURGERY    . HAND SURGERY  2014    Allergies as of 10/17/2017      Reactions   Erythromycin Base Other (See Comments)   GI Upset   Penicillins Other (See Comments)   Unknown childhood reaction      Medication List    TAKE these medications   aspirin EC 81 MG tablet Take 81 mg by mouth daily.   CALCIUM PO Take 2 tablets by mouth daily.   citalopram 20 MG tablet Commonly known as:  CELEXA Take 20 mg by mouth daily.   colestipol 1 g tablet Commonly known as:  COLESTID Take 1 g by mouth 2 (two) times daily.   CRANBERRY PO Take 1 tablet by mouth daily.   cyanocobalamin 1000 MCG/ML injection Commonly known as:  (VITAMIN B-12) Inject 1,000 mcg into the muscle every 30 (thirty) days.   divalproex 500 MG 24 hr tablet Commonly known as:  DEPAKOTE ER Take 1 tablet (500 mg total) by mouth at bedtime.   Evolocumab 140 MG/ML Sosy Commonly known as:  REPATHA Inject 140 mg into the skin every 14 (fourteen) days.   lisinopril 10 MG tablet Commonly known as:  PRINIVIL,ZESTRIL Take 10 mg by mouth every evening.   loratadine 10 MG tablet Commonly known as:  CLARITIN Take 10 mg by mouth daily.   meloxicam 15 MG tablet Commonly known as:  MOBIC Take 15 mg by mouth daily.   metFORMIN 850 MG tablet Commonly known as:  GLUCOPHAGE Take 850 mg by mouth 2 (two) times daily.    multivitamin with minerals Tabs tablet Take 1 tablet by mouth daily.   PROAIR HFA 108 (90 Base) MCG/ACT inhaler Generic drug:  albuterol Inhale 1-2 puffs every 6 hours as needed for shortness of breath.   Vitamin D (Ergocalciferol) 50000 units Caps capsule Commonly known as:  DRISDOL Take 50,000 Units by mouth 2 (two) times a week. On Monday and Thursday       LABORATORY STUDIES CBC    Component Value Date/Time   WBC 9.8 10/16/2017 1208   RBC 4.61 10/16/2017 1208   HGB 14.6 10/16/2017 1215   HGB 12.5 03/01/2017 1656   HCT 43.0 10/16/2017 1215   HCT 37.9 03/01/2017 1656   PLT 255 10/16/2017 1208   PLT 277 03/01/2017 1656   MCV 90.7 10/16/2017 1208   MCV 89 03/01/2017 1656   MCH 30.8 10/16/2017 1208   MCHC 34.0 10/16/2017 1208   RDW 13.2 10/16/2017 1208   RDW 14.9 03/01/2017 1656   LYMPHSABS 1.1 10/16/2017 1208   MONOABS 0.7 10/16/2017 1208   EOSABS 0.1 10/16/2017 1208   BASOSABS 0.0 10/16/2017 1208   CMP    Component Value Date/Time   NA 136 10/16/2017 1215   NA 141  03/01/2017 1656   K 4.7 10/16/2017 1215   CL 100 (L) 10/16/2017 1215   CO2 24 10/16/2017 1208   GLUCOSE 111 (H) 10/16/2017 1215   BUN 22 (H) 10/16/2017 1215   BUN 23 03/01/2017 1656   CREATININE 1.10 (H) 10/16/2017 1215   CALCIUM 9.7 10/16/2017 1208   PROT 6.9 10/16/2017 1208   PROT 6.6 03/01/2017 1656   ALBUMIN 3.9 10/16/2017 1208   ALBUMIN 4.1 03/01/2017 1656   AST 30 10/16/2017 1208   ALT 19 10/16/2017 1208   ALKPHOS 73 10/16/2017 1208   BILITOT 0.6 10/16/2017 1208   BILITOT <0.2 03/01/2017 1656   GFRNONAA 49 (L) 10/16/2017 1208   GFRAA 57 (L) 10/16/2017 1208   COAGS Lab Results  Component Value Date   INR 0.91 10/16/2017   INR 0.91 01/22/2017   Lipid Panel    Component Value Date/Time   CHOL 186 10/17/2017 0650   TRIG 75 10/17/2017 0650   HDL 70 10/17/2017 0650   CHOLHDL 2.7 10/17/2017 0650   VLDL 15 10/17/2017 0650   LDLCALC 101 (H) 10/17/2017 0650   HgbA1C  Lab Results   Component Value Date   HGBA1C 5.7 (H) 10/17/2017   Urinalysis No results found for: COLORURINE, APPEARANCEUR, LABSPEC, PHURINE, GLUCOSEU, HGBUR, BILIRUBINUR, KETONESUR, PROTEINUR, UROBILINOGEN, NITRITE, LEUKOCYTESUR Urine Drug Screen No results found for: LABOPIA, COCAINSCRNUR, LABBENZ, AMPHETMU, THCU, LABBARB  Alcohol Level No results found for: [place] I have personally reviewed the radiological images below and agree with the radiology interpretations.  Ct Head Wo Contrast 10/17/2017 IMPRESSION: 1. No acute intracranial process. 2. Mild parenchymal brain volume loss for age. 3. Old LEFT basal ganglia infarct. Mild to moderate chronic small vessel ischemic disease.   Mr Brain Wo Contrast 10/16/2017 IMPRESSION: 1. No acute intracranial process on limited MR stroke protocol. 2. Mild parenchymal brain volume loss for age. 3. Old LEFT basal ganglia infarct. Mild to moderate chronic small vessel ischemic disease.   Ct Head Code Stroke Wo Contrast 10/16/2017 IMPRESSION: 1. No acute intracranial abnormality. Chronic microvascular ischemic changes, left greater than right. 2. ASPECTS is 10      HISTORY OF PRESENT ILLNESS Bonnie Cook is an 66 y.o. female patient with known history of migraines.  Typical migraine can cause right sided numbness and  Aphasia.  This morning at approximately 7:30 AM patient was noted to have aphasia however family and patient thought this could be a typical migraine headache thus they did not seek any attention.  After approximately 15 minutes the symptoms resolved, however at 8:30 in the morning again the symptoms returned and due to the symptoms not resolving patient was brought to [facility].  Code stroke was called while in triage.  Patient remains to have expressive aphasia.  Patient is on able to continue reliably name objects, repeat, express herself, she is however able to follow commands without any difficulty.  Date  last known well: Date: 10/16/2017 Time last known well: Time: 08:30 tPA Given: Yes NIH stroke scale  2  Modified Rankin: Rankin Score=0  HOSPITAL COURSE Pt received tPA and admitted to neuro ICU. BP in good control. Her MRI showed no acute stroke and repeat CT 24h after tPA showed no bleeding or acute stroke but known old stroke at left BG. Her headache resolved over night with fioricet and her aphasia resolved also over night. EEG again showed left temporal slowing. Although her HA is not frequent, given her stroke mimic presentation, will  initiate depakote ER 500mg  Qhs for HA prevention. Also hope that may help for EEG abnormality and lower the seizure threshold if any concern of seizure like symptoms. She was discharged in good condition.   DISCHARGE EXAM Blood pressure 129/71, pulse 66, temperature 98.5 F (36.9 C), temperature source Oral, resp. rate (!) 26, height 5\' 2"  (1.575 m), weight 165 lb 9.1 oz (75.1 kg), SpO2 97 %.  General - Well nourished, well developed, in no apparent distress.  Ophthalmologic - Sharp disc margins OU.  Cardiovascular - Regular rate and rhythm with no murmur.  Mental Status -  Level of arousal and orientation to time, place, and person were intact. Language including expression, naming, repetition, comprehension was assessed and found intact. Fund of Knowledge was assessed and was intact.  Cranial Nerves II - XII - II - Visual field intact OU. III, IV, VI - Extraocular movements intact. V - Facial sensation intact bilaterally. VII - Facial movement intact bilaterally. VIII - Hearing & vestibular intact bilaterally. X - Palate elevates symmetrically. XI - Chin turning & shoulder shrug intact bilaterally. XII - Tongue protrusion intact.  Motor Strength - The patient's strength was normal in all extremities and pronator drift was absent.  Bulk was normal and fasciculations were absent.   Motor Tone - Muscle tone was assessed at the neck and  appendages and was normal.  Reflexes - The patient's reflexes were 1+ in all extremities and she had no pathological reflexes.  Sensory - Light touch, temperature/pinprick, vibration and proprioception, and Romberg testing were assessed and were normal.    Coordination - The patient had normal movements in the hands and feet with no ataxia or dysmetria.  Tremor was absent.  Gait and Station - The patient's transfers, posture, gait, station, and turns were observed as normal.   Discharge Diet   Diet heart healthy/carb modified Room service appropriate? Yes; Fluid consistency: Thin Diet - low sodium heart healthy liquids  DISCHARGE PLAN  Disposition:  home  aspirin 81 mg daily for secondary stroke prevention.  Ongoing risk factor control by Primary Care Physician at time of discharge  Follow-up Myrlene Broker, MD in 2 weeks.  Follow-up with Dr. Rosalin Hawking as scheduled  35 minutes were spent preparing discharge.  Rosalin Hawking, MD PhD Stroke Neurology 10/17/2017 3:09 PM

## 2017-10-17 NOTE — Progress Notes (Signed)
  Echocardiogram 2D Echocardiogram has been performed.  Bonnie Cook 10/17/2017, 11:49 AM

## 2017-10-17 NOTE — Progress Notes (Signed)
SLP Cancellation Note  Patient Details Name: Bonnie Cook MRN: [id number] DOB: 09/29/1951   Cancelled treatment:       Reason Eval/Treat Not Completed: SLP screened, no needs identified, will sign off   [name] 10/17/2017, 10:56 AM

## 2017-10-17 NOTE — Evaluation (Signed)
Physical Therapy Evaluation/ Discharge Patient Details Name: Bonnie Cook MRN: [id number] DOB: March 03, 1951 Today's Date: 10/17/2017   History of Present Illness  66 year old woman with complicated migraines presenting as right sided numbness and aphasia presents to hospital with habitual symptoms that have not resolved pt did receive tPA. MRI negative. PMHx: asthma, DM, HTN, HLD  Clinical Impression  Pt very pleasant and moving well. Pt reports only speech deficits with migraine initially and currently no deficits with mobility during gait. Pt able to perform transfer, gait and functional activity without need for assist and reports at baseline functional level. Pt without further therapy needs, aware and agreeable Bonnie sign off.  vSS throughout session on RA    Follow Up Recommendations No PT follow up    Equipment Recommendations  None recommended by PT    Recommendations for Other Services       Precautions / Restrictions Precautions Precautions: None      Mobility  Bed Mobility Overal bed mobility: Modified Independent                Transfers Overall transfer level: Modified independent                  Ambulation/Gait Ambulation/Gait assistance: Independent Ambulation Distance (Feet): 600 Feet Assistive device: None Gait Pattern/deviations: WFL(Within Functional Limits)   Gait velocity interpretation: at or above normal speed for age/gender General Gait Details: pt with quick gait with one partial LOB on initial gait after 24hrs bedrest but no further deficits or LOB throughout gait with head turns and increased speed  Stairs Stairs: Yes Stairs assistance: Modified independent (Device/Increase time) Stair Management: One rail Right;Alternating pattern;Forwards Number of Stairs: 2 General stair comments: use of rail with good safety  Wheelchair Mobility    Modified Rankin (Stroke Patients Only)       Balance Overall balance assessment: Needs  assistance   Sitting balance-Leahy Scale: Normal       Standing balance-Leahy Scale: Good Standing balance comment: one partial LOB with gait                             Pertinent Vitals/Pain Pain Assessment: No/denies pain    Home Living Family/patient expects to be discharged to:: Private residence Living Arrangements: Alone Available Help at Discharge: Family;Available PRN/intermittently Type of Home: House Home Access: Stairs to enter   [employer] of Steps: 2 Home Layout: One level Home Equipment: None      Prior Function Level of Independence: Independent         Comments: independent 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     Hand Dominance   Dominant Hand: Right    Extremity/Trunk Assessment   Upper Extremity Assessment Upper Extremity Assessment: Overall WFL for tasks assessed    Lower Extremity Assessment Lower Extremity Assessment: Overall WFL for tasks assessed    Cervical / Trunk Assessment Cervical / Trunk Assessment: Normal  Communication   Communication: No difficulties  Cognition Arousal/Alertness: Awake/alert Behavior During Therapy: WFL for tasks assessed/performed Overall Cognitive Status: Within Functional Limits for tasks assessed                                        General Comments      Exercises     Assessment/Plan    PT Assessment Patent does not need any  further PT services  PT Problem List         PT Treatment Interventions      PT Goals (Current goals can be found in the Care Plan section)  Acute Rehab PT Goals PT Goal Formulation: All assessment and education complete, DC therapy    Frequency     Barriers to discharge        Co-evaluation               AM-PAC PT "6 Clicks" Daily Activity  Outcome Measure Difficulty turning over in bed (including adjusting bedclothes, sheets and blankets)?: None Difficulty moving from lying on back to sitting on the  side of the bed? : None Difficulty sitting down on and standing up from a chair with arms (e.g., wheelchair, bedside commode, etc,.)?: None Help needed moving to and from a bed to chair (including a wheelchair)?: None Help needed walking in hospital room?: None Help needed climbing 3-5 steps with a railing? : None 6 Click Score: 24    End of Session Equipment Utilized During Treatment: Gait belt Activity Tolerance: Patient tolerated treatment well Patient left: in chair;with call bell/phone within reach;with family/visitor present Nurse Communication: Mobility status PT Visit Diagnosis: Other abnormalities of gait and mobility (R26.89)    Time: 1255-1310 PT Time Calculation (min) (ACUTE ONLY): 15 min   Charges:   PT Evaluation $PT Eval Low Complexity: 1 Low     PT G CodesElwyn Reach, PT [phone]   Skyler Dusing B Brea Coleson 10/17/2017, 1:14 PM

## 2017-10-18 ENCOUNTER — Other Ambulatory Visit: Payer: Self-pay

## 2017-10-18 DIAGNOSIS — I639 Cerebral infarction, unspecified: Secondary | ICD-10-CM

## 2017-10-18 NOTE — Consult Note (Signed)
Awaiting Therapy notes for needs.  Request that this [employer] member be assigned to [facility] stroke for follow up.  Natividad Brood, RN BSN [facility] Liaison  [phone] business mobile phone Toll free office [phone]

## 2017-10-18 NOTE — Progress Notes (Addendum)
Late entry for Modified Rankin Score.  Based on review off evaluation by Marquis Buggy, PT.   10/17/17 1330  Modified Rankin (Stroke Patients Only)  Pre-Morbid Rankin Score 0  Modified Rankin 0  [place], [place]  [phone] 10/18/2017

## 2017-11-02 ENCOUNTER — Telehealth: Payer: Self-pay

## 2017-11-02 NOTE — Telephone Encounter (Signed)
PT turn in form to get assistance with Repatha injection. Dr Erlinda Hong sign the form. Pt will pick up before 1200pm on Friday. RN made pt aware office close [place] Tuesday for the holidays.Forms are in envelope for patient or daughter to pick up at front desk.

## 2017-11-03 NOTE — Telephone Encounter (Signed)
Rn call patient that she proof of income in needed sometimes with patient assistance. Rn gave pt the financial assistance number at [phone] to call if they need check stubs, or w2 forms for proof of income. Pt will call to find out. If [employer] needs proof of income she will give them proof it it. Rn stated her application was fax already.

## 2017-11-03 NOTE — Telephone Encounter (Signed)
PT call and requested the repatha pt assistance be fax, and copy mail to them. Rn fax to [phone]. Application was fax three times,and confirmed. Copy put in mail to patients address.

## 2017-11-03 NOTE — Telephone Encounter (Addendum)
Pt's daughter called she is wanting to know if the forms can be faxed to repatha. Daughter said they can pick up the forms next week. Please call the daughter to advise if the forms cannot be faxed, otherwise she will pick them up next week

## 2017-11-03 NOTE — Telephone Encounter (Signed)
Copy of patients assistance form put in mail to patients address.

## 2017-11-18 DIAGNOSIS — N3090 Cystitis, unspecified without hematuria: Secondary | ICD-10-CM | POA: Diagnosis not present

## 2017-11-18 DIAGNOSIS — N3000 Acute cystitis without hematuria: Secondary | ICD-10-CM | POA: Diagnosis not present

## 2017-11-23 NOTE — Telephone Encounter (Signed)
Rn call patient to follow up about her repatha patient assistance. PT stated she sent in her income a week ago. Also she has her copy of the repatha application that was sent to her by medical records [employer]. PT has not heard from the patient assistance. Rn remind pt to call [place] toll free number to inquire about the status of the injection medication.

## 2017-11-24 ENCOUNTER — Encounter: Payer: Self-pay | Admitting: Neurology

## 2017-11-26 DIAGNOSIS — E119 Type 2 diabetes mellitus without complications: Secondary | ICD-10-CM | POA: Diagnosis not present

## 2017-11-26 DIAGNOSIS — R112 Nausea with vomiting, unspecified: Secondary | ICD-10-CM | POA: Diagnosis not present

## 2017-11-26 DIAGNOSIS — R51 Headache: Secondary | ICD-10-CM | POA: Diagnosis not present

## 2017-11-27 ENCOUNTER — Telehealth: Payer: Self-pay | Admitting: Neurology

## 2017-11-27 NOTE — Telephone Encounter (Signed)
Message sent to Dr. Erlinda Hong. My chart sent to Dr. Erlinda Hong.

## 2017-11-27 NOTE — Telephone Encounter (Signed)
Pts daughter is wanting to speak with Dr. Erlinda Hong directly if possible. Pt has had worsening headaches. Going along with the message she sent through [employer]

## 2017-11-28 ENCOUNTER — Emergency Department (HOSPITAL_COMMUNITY): Payer: PPO

## 2017-11-28 ENCOUNTER — Inpatient Hospital Stay (HOSPITAL_COMMUNITY)
Admission: EM | Admit: 2017-11-28 | Discharge: 2017-12-02 | DRG: 643 | Disposition: A | Discharge status: Home Health | Payer: PPO | Attending: Internal Medicine | Admitting: Internal Medicine

## 2017-11-28 ENCOUNTER — Encounter (HOSPITAL_COMMUNITY): Payer: Self-pay | Admitting: Emergency Medicine

## 2017-11-28 ENCOUNTER — Other Ambulatory Visit: Payer: Self-pay

## 2017-11-28 DIAGNOSIS — J45909 Unspecified asthma, uncomplicated: Secondary | ICD-10-CM | POA: Diagnosis not present

## 2017-11-28 DIAGNOSIS — E119 Type 2 diabetes mellitus without complications: Secondary | ICD-10-CM

## 2017-11-28 DIAGNOSIS — Z8673 Personal history of transient ischemic attack (TIA), and cerebral infarction without residual deficits: Secondary | ICD-10-CM

## 2017-11-28 DIAGNOSIS — Z881 Allergy status to other antibiotic agents status: Secondary | ICD-10-CM | POA: Diagnosis not present

## 2017-11-28 DIAGNOSIS — Z823 Family history of stroke: Secondary | ICD-10-CM | POA: Diagnosis not present

## 2017-11-28 DIAGNOSIS — N390 Urinary tract infection, site not specified: Secondary | ICD-10-CM | POA: Diagnosis present

## 2017-11-28 DIAGNOSIS — G9341 Metabolic encephalopathy: Secondary | ICD-10-CM | POA: Diagnosis not present

## 2017-11-28 DIAGNOSIS — G43709 Chronic migraine without aura, not intractable, without status migrainosus: Secondary | ICD-10-CM | POA: Diagnosis not present

## 2017-11-28 DIAGNOSIS — N179 Acute kidney failure, unspecified: Secondary | ICD-10-CM | POA: Diagnosis not present

## 2017-11-28 DIAGNOSIS — R4701 Aphasia: Secondary | ICD-10-CM | POA: Diagnosis not present

## 2017-11-28 DIAGNOSIS — E785 Hyperlipidemia, unspecified: Secondary | ICD-10-CM | POA: Diagnosis not present

## 2017-11-28 DIAGNOSIS — R4189 Other symptoms and signs involving cognitive functions and awareness: Secondary | ICD-10-CM | POA: Diagnosis not present

## 2017-11-28 DIAGNOSIS — I10 Essential (primary) hypertension: Secondary | ICD-10-CM | POA: Diagnosis not present

## 2017-11-28 DIAGNOSIS — E871 Hypo-osmolality and hyponatremia: Secondary | ICD-10-CM | POA: Diagnosis not present

## 2017-11-28 DIAGNOSIS — Z7982 Long term (current) use of aspirin: Secondary | ICD-10-CM | POA: Diagnosis not present

## 2017-11-28 DIAGNOSIS — Z7984 Long term (current) use of oral hypoglycemic drugs: Secondary | ICD-10-CM | POA: Diagnosis not present

## 2017-11-28 DIAGNOSIS — E222 Syndrome of inappropriate secretion of antidiuretic hormone: Secondary | ICD-10-CM | POA: Diagnosis not present

## 2017-11-28 DIAGNOSIS — Z79899 Other long term (current) drug therapy: Secondary | ICD-10-CM

## 2017-11-28 DIAGNOSIS — Z880 Allergy status to penicillin: Secondary | ICD-10-CM

## 2017-11-28 DIAGNOSIS — E7800 Pure hypercholesterolemia, unspecified: Secondary | ICD-10-CM | POA: Diagnosis not present

## 2017-11-28 DIAGNOSIS — N3000 Acute cystitis without hematuria: Secondary | ICD-10-CM

## 2017-11-28 DIAGNOSIS — G43909 Migraine, unspecified, not intractable, without status migrainosus: Secondary | ICD-10-CM | POA: Diagnosis not present

## 2017-11-28 DIAGNOSIS — R4182 Altered mental status, unspecified: Secondary | ICD-10-CM | POA: Diagnosis not present

## 2017-11-28 DIAGNOSIS — Z791 Long term (current) use of non-steroidal anti-inflammatories (NSAID): Secondary | ICD-10-CM

## 2017-11-28 DIAGNOSIS — R569 Unspecified convulsions: Secondary | ICD-10-CM | POA: Diagnosis present

## 2017-11-28 DIAGNOSIS — IMO0002 Reserved for concepts with insufficient information to code with codable children: Secondary | ICD-10-CM | POA: Diagnosis present

## 2017-11-28 DIAGNOSIS — R410 Disorientation, unspecified: Secondary | ICD-10-CM

## 2017-11-28 HISTORY — DX: Spinal stenosis, lumbar region without neurogenic claudication: M48.061

## 2017-11-28 HISTORY — DX: Unspecified osteoarthritis, unspecified site: M19.90

## 2017-11-28 HISTORY — DX: Type 2 diabetes mellitus without complications: E11.9

## 2017-11-28 HISTORY — DX: Personal history of urinary calculi: Z87.442

## 2017-11-28 HISTORY — DX: Basal cell carcinoma of skin, unspecified: C44.91

## 2017-11-28 HISTORY — DX: Cerebral infarction, unspecified: I63.9

## 2017-11-28 HISTORY — DX: Unspecified convulsions: R56.9

## 2017-11-28 HISTORY — DX: Migraine, unspecified, not intractable, without status migrainosus: G43.909

## 2017-11-28 HISTORY — DX: Depression, unspecified: F32.A

## 2017-11-28 HISTORY — DX: Gastro-esophageal reflux disease without esophagitis: K21.9

## 2017-11-28 HISTORY — DX: Major depressive disorder, single episode, unspecified: F32.9

## 2017-11-28 LAB — COMPREHENSIVE METABOLIC PANEL
ALT: 25 U/L (ref 14–54)
ANION GAP: 12 (ref 5–15)
AST: 32 U/L (ref 15–41)
Albumin: 3.4 g/dL — ABNORMAL LOW (ref 3.5–5.0)
Alkaline Phosphatase: 62 U/L (ref 38–126)
BUN: 18 mg/dL (ref 6–20)
CHLORIDE: 88 mmol/L — AB (ref 101–111)
CO2: 23 mmol/L (ref 22–32)
CREATININE: 1.04 mg/dL — AB (ref 0.44–1.00)
Calcium: 8.9 mg/dL (ref 8.9–10.3)
GFR, EST NON AFRICAN AMERICAN: 54 mL/min — AB (ref >60)
Glucose, Bld: 106 mg/dL — ABNORMAL HIGH (ref 65–99)
POTASSIUM: 4.4 mmol/L (ref 3.5–5.1)
SODIUM: 123 mmol/L — AB (ref 135–145)
Total Bilirubin: 0.5 mg/dL (ref 0.3–1.2)
Total Protein: 6.3 g/dL — ABNORMAL LOW (ref 6.5–8.1)

## 2017-11-28 LAB — BASIC METABOLIC PANEL
ANION GAP: 11 (ref 5–15)
BUN: 16 mg/dL (ref 6–20)
CO2: 22 mmol/L (ref 22–32)
Calcium: 8.2 mg/dL — ABNORMAL LOW (ref 8.9–10.3)
Chloride: 96 mmol/L — ABNORMAL LOW (ref 101–111)
Creatinine, Ser: 0.92 mg/dL (ref 0.44–1.00)
GFR calc non Af Amer: >60 mL/min (ref >60)
GLUCOSE: 94 mg/dL (ref 65–99)
Potassium: 4.2 mmol/L (ref 3.5–5.1)
Sodium: 129 mmol/L — ABNORMAL LOW (ref 135–145)

## 2017-11-28 LAB — CBC
HCT: 37.8 % (ref 36.0–46.0)
HEMOGLOBIN: 13.1 g/dL (ref 12.0–15.0)
MCH: 30.4 pg (ref 26.0–34.0)
MCHC: 34.7 g/dL (ref 30.0–36.0)
MCV: 87.7 fL (ref 78.0–100.0)
PLATELETS: 349 10*3/uL (ref 150–400)
RBC: 4.31 MIL/uL (ref 3.87–5.11)
RDW: 12.7 % (ref 11.5–15.5)
WBC: 12 10*3/uL — AB (ref 4.0–10.5)

## 2017-11-28 LAB — VALPROIC ACID LEVEL: Valproic Acid Lvl: 34 ug/mL — ABNORMAL LOW (ref 50.0–100.0)

## 2017-11-28 LAB — CBG MONITORING, ED: Glucose-Capillary: 103 mg/dL — ABNORMAL HIGH (ref 65–99)

## 2017-11-28 MED ORDER — HYDROCODONE-ACETAMINOPHEN 5-325 MG PO TABS
1.0000 | ORAL_TABLET | ORAL | Status: DC | PRN
  Administered 2017-11-29 – 2017-12-01 (×2): 1 via ORAL
  Filled 2017-11-28 (×2): qty 1

## 2017-11-28 MED ORDER — ACETAMINOPHEN 650 MG RE SUPP: 650.0000 mg | Freq: Four times a day (QID) | RECTAL | Status: DC | PRN

## 2017-11-28 MED ORDER — ADULT MULTIVITAMIN W/MINERALS CH
1.0000 | ORAL_TABLET | Freq: Every day | ORAL | Status: DC
  Administered 2017-11-29 – 2017-12-02 (×4): 1 via ORAL
  Filled 2017-11-28 (×4): qty 1

## 2017-11-28 MED ORDER — KETOROLAC TROMETHAMINE 15 MG/ML IJ SOLN
15.0000 mg | Freq: Four times a day (QID) | INTRAMUSCULAR | Status: DC | PRN
  Administered 2017-11-29: 15 mg via INTRAVENOUS
  Filled 2017-11-28: qty 1

## 2017-11-28 MED ORDER — ASPIRIN EC 81 MG PO TBEC
81.0000 mg | DELAYED_RELEASE_TABLET | Freq: Every day | ORAL | Status: DC
  Administered 2017-11-29 – 2017-12-02 (×4): 81 mg via ORAL
  Filled 2017-11-28 (×4): qty 1

## 2017-11-28 MED ORDER — SODIUM CHLORIDE 0.9% FLUSH
3.0000 mL | Freq: Two times a day (BID) | INTRAVENOUS | Status: DC
  Administered 2017-11-29 – 2017-12-01 (×3): 3 mL via INTRAVENOUS

## 2017-11-28 MED ORDER — SENNOSIDES-DOCUSATE SODIUM 8.6-50 MG PO TABS: 1.0000 | ORAL_TABLET | Freq: Every evening | ORAL | Status: DC | PRN

## 2017-11-28 MED ORDER — COLESTIPOL HCL 1 G PO TABS
1.0000 g | ORAL_TABLET | Freq: Two times a day (BID) | ORAL | Status: DC
  Administered 2017-11-29 – 2017-12-02 (×8): 1 g via ORAL
  Filled 2017-11-28 (×10): qty 1

## 2017-11-28 MED ORDER — SODIUM CHLORIDE 0.9% FLUSH: 3.0000 mL | INTRAVENOUS | Status: DC | PRN

## 2017-11-28 MED ORDER — MELOXICAM 7.5 MG PO TABS
15.0000 mg | ORAL_TABLET | Freq: Every day | ORAL | Status: DC
  Administered 2017-11-29 – 2017-11-30 (×2): 15 mg via ORAL
  Filled 2017-11-28 (×2): qty 2

## 2017-11-28 MED ORDER — LISINOPRIL 10 MG PO TABS: 10.0000 mg | ORAL_TABLET | Freq: Every evening | ORAL | Status: DC

## 2017-11-28 MED ORDER — CIPROFLOXACIN HCL 500 MG PO TABS
500.0000 mg | ORAL_TABLET | Freq: Two times a day (BID) | ORAL | Status: DC
  Administered 2017-11-29: 500 mg via ORAL
  Filled 2017-11-28: qty 1

## 2017-11-28 MED ORDER — ENOXAPARIN SODIUM 40 MG/0.4ML ~~LOC~~ SOLN
40.0000 mg | SUBCUTANEOUS | Status: DC
  Administered 2017-11-29 (×2): 40 mg via SUBCUTANEOUS
  Filled 2017-11-28 (×2): qty 0.4

## 2017-11-28 MED ORDER — SODIUM CHLORIDE 0.9 % IV SOLN: 250.0000 mL | INTRAVENOUS | Status: DC | PRN

## 2017-11-28 MED ORDER — ONDANSETRON HCL 4 MG/2ML IJ SOLN
4.0000 mg | Freq: Four times a day (QID) | INTRAMUSCULAR | Status: DC | PRN
  Administered 2017-11-29: 4 mg via INTRAVENOUS
  Filled 2017-11-28: qty 2

## 2017-11-28 MED ORDER — MULTIVITAMIN ADULT PO CHEW: 2.0000 | CHEWABLE_TABLET | Freq: Every day | ORAL | Status: DC

## 2017-11-28 MED ORDER — SODIUM CHLORIDE 0.9 % IV BOLUS (SEPSIS)
1000.0000 mL | Freq: Once | INTRAVENOUS | Status: AC
  Administered 2017-11-28: 1000 mL via INTRAVENOUS

## 2017-11-28 MED ORDER — ACETAMINOPHEN 325 MG PO TABS: 650.0000 mg | ORAL_TABLET | Freq: Four times a day (QID) | ORAL | Status: DC | PRN

## 2017-11-28 MED ORDER — SODIUM CHLORIDE 0.9% FLUSH
3.0000 mL | Freq: Two times a day (BID) | INTRAVENOUS | Status: DC
  Administered 2017-11-29 – 2017-12-01 (×6): 3 mL via INTRAVENOUS

## 2017-11-28 MED ORDER — CITALOPRAM HYDROBROMIDE 20 MG PO TABS
20.0000 mg | ORAL_TABLET | Freq: Every day | ORAL | Status: DC
  Administered 2017-11-29 – 2017-12-02 (×4): 20 mg via ORAL
  Filled 2017-11-28 (×4): qty 1

## 2017-11-28 MED ORDER — ONDANSETRON HCL 4 MG PO TABS: 4.0000 mg | ORAL_TABLET | Freq: Four times a day (QID) | ORAL | Status: DC | PRN

## 2017-11-28 MED ORDER — ALBUTEROL SULFATE (2.5 MG/3ML) 0.083% IN NEBU: 3.0000 mL | INHALATION_SOLUTION | RESPIRATORY_TRACT | Status: DC | PRN

## 2017-11-28 MED ORDER — LORATADINE 10 MG PO TABS
10.0000 mg | ORAL_TABLET | Freq: Every day | ORAL | Status: DC
  Administered 2017-11-29 – 2017-12-02 (×4): 10 mg via ORAL
  Filled 2017-11-28 (×4): qty 1

## 2017-11-28 NOTE — ED Notes (Signed)
Sent message to pharmacy to verify medications 

## 2017-11-28 NOTE — Progress Notes (Signed)
Pt arrived 5W23 in the presence of this RN and family member. No complaints of pain. Pt was assessed to be AxOx2. Skin assessment completed. Cardiac telemetry initiated. Will continue to monitor.

## 2017-11-28 NOTE — H&P (Signed)
History and Physical    Bonnie Cook ZWC:[id number] DOB: [birth year] DOA: 11/28/2017  PCP: Myrlene Broker, MD   Patient coming from: Home  Chief Complaint: Confusion   HPI: Bonnie Cook is a 67 y.o. female with medical history significant for chronic migraines, type 2 diabetes mellitus, and hypertension, now presenting to the emergency department for evaluation of confusion.  Patient reportedly developed confusion with her migraine headaches frequently.  She has been experiencing worsening headaches recently and was started on Depakote by her neurologist.  She had a severe migraine that was treated a couple days ago at urgent care with Toradol and Benadryl injections.  Headache has since resolved, but her confusion has continued to worsen.  She was recently diagnosed with UTI and has been treated with antibiotics.  She denies any fevers or flank pain.  Denies fall or trauma.    ED Course: Upon arrival to the ED, patient is found to be afebrile, saturating well on room air, and was otherwise normal.  Noncontrast head CT is negative for acute intracranial abnormality.  Chemistry panel features a sodium of 123, normal 6 weeks ago.  CBC features a leukocytosis to 12,000.  Patient was treated with a liter of normal saline in the ED she remains hemodynamically stable, in no apparent respiratory distress, but is slightly confused, likely secondary to hyponatremia, and will be admitted to the hospital for ongoing evaluation and management of this.  Review of Systems:  All other systems reviewed and apart from HPI, are negative.  Past Medical History:  Diagnosis Date  . Asthma   . Diabetes mellitus without complication ([place])   . Headache   . Hypercholesteremia   . Hypertension     Past Surgical History:  Procedure Laterality Date  . BACK SURGERY    . HAND SURGERY  2014     reports that  has never smoked. she has never used smokeless tobacco. She reports that she does not drink alcohol or  use drugs.  Allergies  Allergen Reactions  . Erythromycin Base Other (See Comments)    GI Upset  . Penicillins Rash    From childhood: Has patient had a PCN reaction causing immediate rash, facial/tongue/throat swelling, SOB or lightheadedness with hypotension: Yes Has patient had a PCN reaction causing severe rash involving mucus membranes or skin necrosis: Unk Has patient had a PCN reaction that required hospitalization: Unk Has patient had a PCN reaction occurring within the last 10 years: No If all of the above answers are "NO", then may proceed with Cephalosporin use.      Family History  Problem Relation Age of Onset  . Transient ischemic attack Mother   . Alzheimer's disease Mother   . Cancer Mother   . Heart disease Father   . Stroke Maternal Grandmother   . Migraines Daughter      Prior to Admission medications   Medication Sig Start Date End Date Taking? Authorizing Provider  albuterol (PROAIR HFA) 108 (90 Base) MCG/ACT inhaler Inhale 1-2 puffs into the lungs every 6 hours as needed for shortness of breath or wheezing   Yes [provider]  aspirin EC 81 MG tablet Take 81 mg by mouth daily.   Yes [provider]  ciprofloxacin (CIPRO) 500 MG tablet Take 500 mg by mouth 2 (two) times daily. FOR 7 DAYS 11/24/17 11/30/17 Yes [provider]  citalopram (CELEXA) 20 MG tablet Take 20 mg by mouth daily. 01/18/17  Yes [provider]  colestipol (  COLESTID) 1 g tablet Take 1 g by mouth 2 (two) times daily. 01/18/17  Yes [provider]  CRANBERRY PO Take 4 tablets by mouth daily.    Yes [provider]  cyanocobalamin (,VITAMIN B-12,) 1000 MCG/ML injection Inject 1,000 mcg into the muscle every 30 (thirty) days.  01/18/17  Yes [provider]  divalproex (DEPAKOTE ER) 500 MG 24 hr tablet Take 1 tablet (500 mg total) by mouth at bedtime. 10/17/17  Yes Rosalin Hawking, MD  Evolocumab (REPATHA) 140 MG/ML SOSY Inject 140 mg into  the skin every 14 (fourteen) days. 03/02/17  Yes Rosalin Hawking, MD  lisinopril (PRINIVIL,ZESTRIL) 10 MG tablet Take 10 mg by mouth every evening. 01/18/17  Yes [provider]  loratadine (CLARITIN) 10 MG tablet Take 10 mg by mouth daily.   Yes [provider]  meloxicam (MOBIC) 15 MG tablet Take 15 mg by mouth daily. 01/03/17  Yes [provider]  metFORMIN (GLUCOPHAGE) 850 MG tablet Take 850 mg by mouth 2 (two) times daily. 01/18/17  Yes [provider]  ondansetron (ZOFRAN-ODT) 4 MG disintegrating tablet Take 4 mg by mouth 2 (two) times daily as needed for nausea/vomiting. 11/27/17  Yes [provider]  Vitamin D, Ergocalciferol, (DRISDOL) 50000 units CAPS capsule Take 50,000 Units by mouth 2 (two) times a week. On Monday and Thursday 01/18/17  Yes [provider]  Multiple Vitamins-Minerals (MULTIVITAMIN ADULT) CHEW Chew 2 tablets by mouth daily.    [provider]    Physical Exam: Vitals:   11/28/17 1915 11/28/17 1945 11/28/17 2000 11/28/17 2030  BP: 134/73 (!) 111/58 136/85 123/81  Pulse: 72 91  (!) 58  Resp: 19 20 14  (!) 25  Temp:      TempSrc:      SpO2: 98% 98%  94%  Weight:      Height:          Constitutional: NAD, calm Eyes: PERTLA, lids and conjunctivae normal ENMT: Mucous membranes are moist. Posterior pharynx clear of any exudate or lesions.   Neck: normal, supple, no masses, no thyromegaly Respiratory: clear to auscultation bilaterally, no wheezing, no crackles. Normal respiratory effort.    Cardiovascular: S1 & S2 heard, regular rate and rhythm. No significant JVD. Abdomen: No distension, no tenderness, no masses palpated. Bowel sounds normal.  Musculoskeletal: no clubbing / cyanosis. No joint deformity upper and lower extremities.    Skin: no significant rashes, lesions, ulcers. Warm, dry, well-perfused. Neurologic: CN 2-12 grossly intact. Sensation intact. Strength 5/5 in all 4 limbs.  Psychiatric: Alert and  oriented, but mild confusion. Pleasant, cooperative.     Labs on Admission: I have personally reviewed following labs and imaging studies  CBC: Recent Labs  Lab 11/28/17 1801  WBC 12.0*  HGB 13.1  HCT 37.8  MCV 87.7  PLT 811   Basic Metabolic Panel: Recent Labs  Lab 11/28/17 1801  NA 123*  K 4.4  CL 88*  CO2 23  GLUCOSE 106*  BUN 18  CREATININE 1.04*  CALCIUM 8.9   GFR: Estimated Creatinine Clearance: 49.9 mL/min (A) (by C-G formula based on SCr of 1.04 mg/dL (H)). Liver Function Tests: Recent Labs  Lab 11/28/17 1801  AST 32  ALT 25  ALKPHOS 62  BILITOT 0.5  PROT 6.3*  ALBUMIN 3.4*   No results for input(s): LIPASE, AMYLASE in the last 168 hours. No results for input(s): AMMONIA in the last 168 hours. Coagulation Profile: No results for input(s): INR, PROTIME in the last  168 hours. Cardiac Enzymes: No results for input(s): CKTOTAL, CKMB, CKMBINDEX, TROPONINI in the last 168 hours. BNP (last 3 results) No results for input(s): PROBNP in the last 8760 hours. HbA1C: No results for input(s): HGBA1C in the last 72 hours. CBG: Recent Labs  Lab 11/28/17 1800  GLUCAP 103*   Lipid Profile: No results for input(s): CHOL, HDL, LDLCALC, TRIG, CHOLHDL, LDLDIRECT in the last 72 hours. Thyroid Function Tests: No results for input(s): TSH, T4TOTAL, FREET4, T3FREE, THYROIDAB in the last 72 hours. Anemia Panel: No results for input(s): VITAMINB12, FOLATE, FERRITIN, TIBC, IRON, RETICCTPCT in the last 72 hours. Urine analysis: No results found for: COLORURINE, APPEARANCEUR, LABSPEC, PHURINE, GLUCOSEU, HGBUR, BILIRUBINUR, KETONESUR, PROTEINUR, UROBILINOGEN, NITRITE, LEUKOCYTESUR Sepsis Labs: @LABRCNTIP (procalcitonin:4,lacticidven:4) )No results found for this or any previous visit (from the past 240 hour(s)).   Radiological Exams on Admission: Ct Head Wo Contrast  Result Date: 11/28/2017 CLINICAL DATA:  Migraine last week. Recent UTI. Altered mental status. EXAM:  CT HEAD WITHOUT CONTRAST TECHNIQUE: Contiguous axial images were obtained from the base of the skull through the vertex without intravenous contrast. COMPARISON:  10/17/2017 FINDINGS: Brain: Ventricles, cisterns and other CSF spaces are within normal. There is chronic ischemic microvascular disease. Old lacune infarct over the left lentiform nucleus/internal capsule. No evidence of mass, mass effect, shift of midline structures or acute hemorrhage. No evidence of acute infarction. Vascular: No hyperdense vessel or unexpected calcification. Skull: Normal. Negative for fracture or focal lesion. Sinuses/Orbits: No acute finding. Other: None. IMPRESSION: No acute findings. Chronic ischemic microvascular disease. Old left basal ganglia lacunar infarct. Electronically Signed   By: Marin Olp M.D.   On: 11/28/2017 19:44    EKG: Not performed.    Assessment/Plan  1. Hyponatremia  - Presents with confusion, worsening over the past 4-5 days  - Serum sodium is 123 on admission, down from 136 six weeks ago  - She appears euvolemic, no hyperglycemia noted, not jaundiced or on diuretics  - Suspect SIADH from Depakote, recently started  - She was given a liter of NS in ED  - Plan to check urine osm and urine sodium, hold Depakote, SLIV, fluid-restrict diet, follow serial chem panels with goal sodium 130 in 24 hrs   2. Acute encephalopathy  - Presents with confusion, worsening over past 4-5 days  - Head CT negative for acute intracranial abnormality  - She often develops confusion with her migraines, but confusion continued to worsen despite resolution of HA  - Suspect this is secondary to hyponatremia, addressed above  - She is currently being treated for UTI switched from [place] to Cipro based on culture that is not available in EMR  - Treat hyponatremia as above, check UA and culture and continue Cipro   3. UTI  - Diagnosed several days ago, started on Macrobid, reportedly switched to Cipro based on  culture that is not available in EMR   - There is a leukocytosis noted, but no fever and no flank pain  - Culture urine, continue Cipro    4. Chronic migraine - No headache on admission  - Depakote held on admission with concern for SIADH    5. Type II DM  - A1c was 5.7% last month  - Managed at home with metformin only, held on admission  - Check CBG's and start SSI with Novolog    6. Hypertension  - BP at goal  - Continue lisinopril    DVT prophylaxis: Lovenox Code Status: Full  Family Communication: Son and  daughter updated at bedside Disposition Plan: Admit to telemetry Consults called: None Admission status: Inpatient    Vianne Bulls, MD [facility] Pager [phone]  If 7PM-7AM, please contact night-coverage [email or web address] Password [facility]  11/28/2017, 8:47 PM

## 2017-11-28 NOTE — ED Provider Notes (Signed)
[facility] EMERGENCY DEPARTMENT Provider Note  CSN: [id number] Arrival date & time: 11/28/17 1704  Chief Complaint(s) Altered Mental Status  HPI Bonnie Cook is a 67 y.o. female   The history is provided by the patient and a relative.  Altered Mental Status   This is a recurrent problem. Episode onset: 5-7 days. Progression since onset: fluctuating. Associated symptoms include confusion. Pertinent negatives include no seizures, no unresponsiveness, no agitation, no delusions and no hallucinations. Risk factors include a recent infection. Her past medical history is significant for CVA and TIA.   Currently being treated for UTI. Initially with Macrobid (treated for 6 days) but cultures showed resistance to it, so changed to Cipro 5 days ago (7 day course).  Started on Depakote in Dec 2018 for migraine headaches.  Past Medical History Past Medical History:  Diagnosis Date  . Asthma   . Diabetes mellitus without complication ([place])   . Headache   . Hypercholesteremia   . Hypertension    Patient Active Problem List   Diagnosis Date Noted  . Acute metabolic encephalopathy 69/62/9528  . Hyponatremia 11/28/2017  . Chronic migraine 11/28/2017  . History of stroke 10/16/2017  . MCI (mild cognitive impairment) 08/29/2017  . Abnormal EEG 03/02/2017  . Complicated migraine 41/32/4401  . Headache 03/01/2017  . Essential hypertension   . Pure hypercholesterolemia   . Diabetes mellitus without complication ([place])   . Word finding difficulty   . TIA (transient ischemic attack) 01/22/2017   Home Medication(s) Prior to Admission medications   Medication Sig Start Date End Date Taking? Authorizing Provider  albuterol (PROAIR HFA) 108 (90 Base) MCG/ACT inhaler Inhale 1-2 puffs into the lungs every 6 hours as needed for shortness of breath or wheezing   Yes [provider]  aspirin EC 81 MG tablet Take 81 mg by mouth daily.   Yes [provider]    ciprofloxacin (CIPRO) 500 MG tablet Take 500 mg by mouth 2 (two) times daily. FOR 7 DAYS 11/24/17 11/30/17 Yes [provider]  citalopram (CELEXA) 20 MG tablet Take 20 mg by mouth daily. 01/18/17  Yes [provider]  colestipol (COLESTID) 1 g tablet Take 1 g by mouth 2 (two) times daily. 01/18/17  Yes [provider]  CRANBERRY PO Take 4 tablets by mouth daily.    Yes [provider]  cyanocobalamin (,VITAMIN B-12,) 1000 MCG/ML injection Inject 1,000 mcg into the muscle every 30 (thirty) days.  01/18/17  Yes [provider]  divalproex (DEPAKOTE ER) 500 MG 24 hr tablet Take 1 tablet (500 mg total) by mouth at bedtime. 10/17/17  Yes Rosalin Hawking, MD  Evolocumab (REPATHA) 140 MG/ML SOSY Inject 140 mg into the skin every 14 (fourteen) days. 03/02/17  Yes Rosalin Hawking, MD  lisinopril (PRINIVIL,ZESTRIL) 10 MG tablet Take 10 mg by mouth every evening. 01/18/17  Yes [provider]  loratadine (CLARITIN) 10 MG tablet Take 10 mg by mouth daily.   Yes [provider]  meloxicam (MOBIC) 15 MG tablet Take 15 mg by mouth daily. 01/03/17  Yes [provider]  metFORMIN (GLUCOPHAGE) 850 MG tablet Take 850 mg by mouth 2 (two) times daily. 01/18/17  Yes [provider]  ondansetron (ZOFRAN-ODT) 4 MG disintegrating tablet Take 4 mg by mouth 2 (two) times daily as needed for nausea/vomiting. 11/27/17  Yes [provider]  Vitamin D, Ergocalciferol, (DRISDOL) 50000 units CAPS capsule Take 50,000 Units by mouth 2 (two) times a week. On Monday  and Thursday 01/18/17  Yes [provider]  Multiple Vitamins-Minerals (MULTIVITAMIN ADULT) CHEW Chew 2 tablets by mouth daily.    [provider]                                                                                                                                    Past Surgical History Past Surgical History:  Procedure Laterality Date  . BACK SURGERY    . HAND SURGERY  2014    Family History Family History  Problem Relation Age of Onset  . Transient ischemic attack Mother   . Alzheimer's disease Mother   . Cancer Mother   . Heart disease Father   . Stroke Maternal Grandmother   . Migraines Daughter     Social History Social History   Tobacco Use  . Smoking status: Never Smoker  . Smokeless tobacco: Never Used  Substance Use Topics  . Alcohol use: No  . Drug use: No   Allergies Erythromycin base and Penicillins  Review of Systems Review of Systems  Unable to perform ROS: Mental status change  Neurological: Negative for seizures.  Psychiatric/Behavioral: Positive for confusion. Negative for agitation and hallucinations.     Physical Exam Vital Signs  I have reviewed the triage vital signs BP (!) 146/84 (BP Location: Left Arm)   Pulse 84   Temp 98.7 F (37.1 C) (Oral)   Resp 17   Ht 5\' 3"  (1.6 m)   Wt 73.9 kg (163 lb)   SpO2 98%   BMI 28.87 kg/m   Physical Exam  Constitutional: She appears well-developed and well-nourished. No distress.  HENT:  Head: Normocephalic and atraumatic.  Nose: Nose normal.  Eyes: Conjunctivae and EOM are normal. Pupils are equal, round, and reactive to light. Right eye exhibits no discharge. Left eye exhibits no discharge. No scleral icterus.  Neck: Normal range of motion. Neck supple.  Cardiovascular: Normal rate and regular rhythm. Exam reveals no gallop and no friction rub.  No murmur heard. Pulmonary/Chest: Effort normal and breath sounds normal. No stridor. No respiratory distress. She has no rales.  Abdominal: Soft. She exhibits no distension. There is no tenderness.  Musculoskeletal: She exhibits no edema or tenderness.  Neurological: She is alert. She is disoriented (oriented to self and place).  Moves all extremities  Skin: Skin is warm and dry. No rash noted. She is not diaphoretic. No erythema.  Psychiatric: She has a normal mood and affect.  Vitals reviewed.   ED Results and  Treatments Labs (all labs ordered are listed, but only abnormal results are displayed) Labs Reviewed  COMPREHENSIVE METABOLIC PANEL - Abnormal; Notable for the following components:      Result Value   Sodium 123 (*)    Chloride 88 (*)    Glucose, Bld 106 (*)    Creatinine, Ser 1.04 (*)    Total Protein 6.3 (*)    Albumin 3.4 (*)  GFR calc non Af Amer 54 (*)    All other components within normal limits  CBC - Abnormal; Notable for the following components:   WBC 12.0 (*)    All other components within normal limits  CBG MONITORING, ED - Abnormal; Notable for the following components:   Glucose-Capillary 103 (*)    All other components within normal limits  URINE CULTURE  URINALYSIS, ROUTINE W REFLEX MICROSCOPIC  BASIC METABOLIC PANEL  BASIC METABOLIC PANEL  BASIC METABOLIC PANEL  CBC WITH DIFFERENTIAL/PLATELET  SODIUM, URINE, RANDOM  OSMOLALITY, URINE                                                                                                                         EKG  EKG Interpretation  Date/Time:    Ventricular Rate:    PR Interval:    QRS Duration:   QT Interval:    QTC Calculation:   R Axis:     Text Interpretation:        Radiology Ct Head Wo Contrast  Result Date: 11/28/2017 CLINICAL DATA:  Migraine last week. Recent UTI. Altered mental status. EXAM: CT HEAD WITHOUT CONTRAST TECHNIQUE: Contiguous axial images were obtained from the base of the skull through the vertex without intravenous contrast. COMPARISON:  10/17/2017 FINDINGS: Brain: Ventricles, cisterns and other CSF spaces are within normal. There is chronic ischemic microvascular disease. Old lacune infarct over the left lentiform nucleus/internal capsule. No evidence of mass, mass effect, shift of midline structures or acute hemorrhage. No evidence of acute infarction. Vascular: No hyperdense vessel or unexpected calcification. Skull: Normal. Negative for fracture or focal lesion. Sinuses/Orbits: No  acute finding. Other: None. IMPRESSION: No acute findings. Chronic ischemic microvascular disease. Old left basal ganglia lacunar infarct. Electronically Signed   By: Marin Olp M.D.   On: 11/28/2017 19:44   Pertinent labs & imaging results that were available during my care of the patient were reviewed by me and considered in my medical decision making (see chart for details).  Medications Ordered in ED Medications  albuterol (PROVENTIL HFA;VENTOLIN HFA) 108 (90 Base) MCG/ACT inhaler 1-2 puff (not administered)  aspirin EC tablet 81 mg (not administered)  ciprofloxacin (CIPRO) tablet 500 mg (not administered)  citalopram (CELEXA) tablet 20 mg (not administered)  colestipol (COLESTID) tablet 1 g (not administered)  lisinopril (PRINIVIL,ZESTRIL) tablet 10 mg (not administered)  loratadine (CLARITIN) tablet 10 mg (not administered)  meloxicam (MOBIC) tablet 15 mg (not administered)  MULTIVITAMIN ADULT CHEW 2 tablet (not administered)  enoxaparin (LOVENOX) injection 40 mg (not administered)  sodium chloride flush (NS) 0.9 % injection 3 mL (not administered)  sodium chloride flush (NS) 0.9 % injection 3 mL (not administered)  sodium chloride flush (NS) 0.9 % injection 3 mL (not administered)  0.9 %  sodium chloride infusion (not administered)  acetaminophen (TYLENOL) tablet 650 mg (not administered)    Or  acetaminophen (TYLENOL) suppository 650 mg (not administered)  HYDROcodone-acetaminophen (NORCO/VICODIN) 5-325 MG per tablet 1-2 tablet (  not administered)  ketorolac (TORADOL) 15 MG/ML injection 15 mg (not administered)  senna-docusate (Senokot-S) tablet 1 tablet (not administered)  ondansetron (ZOFRAN) tablet 4 mg (not administered)    Or  ondansetron (ZOFRAN) injection 4 mg (not administered)  sodium chloride 0.9 % bolus 1,000 mL (0 mLs Intravenous Stopped 11/28/17 2044)                                                                                                                                     Procedures Procedures CRITICAL CARE Performed by: Grayce Sessions Cardama Total critical care time: 30 minutes Critical care time was exclusive of separately billable procedures and treating other patients. Critical care was necessary to treat or prevent imminent or life-threatening deterioration. Critical care was time spent personally by me on the following activities: development of treatment plan with patient and/or surrogate as well as nursing, discussions with consultants, evaluation of patient's response to treatment, examination of patient, obtaining history from patient or surrogate, ordering and performing treatments and interventions, ordering and review of laboratory studies, ordering and review of radiographic studies, pulse oximetry and re-evaluation of patient's condition.   (including critical care time)  Medical Decision Making / ED Course I have reviewed the nursing notes for this encounter and the patient's prior records (if available in EHR or on provided paperwork).    Workup revealed hyponatremia likely the cause of the patient's confusion.  CT head revealed remote lacunar infarct but no acute changes.  Patient has mild leukocytosis.  Still pending UA to determine effectiveness of treatment.  Hyponatremia treatment was initiated with normal saline bolus.  Case was discussed with medicine who will admit for further workup and management.  Final Clinical Impression(s) / ED Diagnoses Final diagnoses:  Confusion  Hyponatremia      This chart was dictated using voice recognition software.  Despite best efforts to proofread,  errors can occur which can change the documentation meaning.   Fatima Blank, MD 11/28/17 2049

## 2017-11-28 NOTE — ED Triage Notes (Signed)
Daughter stated, she's had a UTI and she had a migraine on Thursday and she normally gets confused when she has migraines.  She started being confused on  Thursday and this morning she did not even know me. Pt. Alert to place, president, and daughter's name. Not oriented to year stated, it was 1978 \\Neg . VAN

## 2017-11-28 NOTE — Telephone Encounter (Signed)
Called patient daughter and discussed with her over the phone.  She states that patient had started to have headache 11/19/17.  Took Fioricet with improvement.  However last Friday started to have confusion, found to have UTI put on Cipro.  Second day, patient again had a headache with confusion, not able to know how to unlock the door, how to dial the phone.  2 days ago, patient again had severe migraine 1 to urgent care, received steroids Benadryl and Toradol shots.  Headache improved some however seems confusion getting worse over time.  This morning she was given not able to recognize her daughter, and also with some word finding difficulties.  Daughter had made appointment with primary care doctor this afternoon, however, after our discussion, daughter has decided to bring patient to [facility] ER for further evaluation.  We will follow along her course here.  Rosalin Hawking, MD PhD Stroke Neurology 11/28/2017 2:53 PM

## 2017-11-29 ENCOUNTER — Encounter (HOSPITAL_COMMUNITY): Payer: Self-pay | Admitting: General Practice

## 2017-11-29 ENCOUNTER — Inpatient Hospital Stay (HOSPITAL_COMMUNITY): Payer: PPO

## 2017-11-29 ENCOUNTER — Other Ambulatory Visit: Payer: Self-pay

## 2017-11-29 DIAGNOSIS — R410 Disorientation, unspecified: Secondary | ICD-10-CM

## 2017-11-29 LAB — BASIC METABOLIC PANEL
ANION GAP: 11 (ref 5–15)
ANION GAP: 11 (ref 5–15)
Anion gap: 11 (ref 5–15)
Anion gap: 12 (ref 5–15)
BUN: 15 mg/dL (ref 6–20)
BUN: 16 mg/dL (ref 6–20)
BUN: 16 mg/dL (ref 6–20)
BUN: 15 mg/dL (ref 6–20)
CALCIUM: 8.2 mg/dL — AB (ref 8.9–10.3)
CALCIUM: 8.4 mg/dL — AB (ref 8.9–10.3)
CALCIUM: 8.3 mg/dL — AB (ref 8.9–10.3)
CHLORIDE: 95 mmol/L — AB (ref 101–111)
CO2: 24 mmol/L (ref 22–32)
CO2: 24 mmol/L (ref 22–32)
CO2: 24 mmol/L (ref 22–32)
CO2: 23 mmol/L (ref 22–32)
CREATININE: 1.07 mg/dL — AB (ref 0.44–1.00)
CREATININE: 1.23 mg/dL — AB (ref 0.44–1.00)
CREATININE: 1.21 mg/dL — AB (ref 0.44–1.00)
CREATININE: 1.14 mg/dL — AB (ref 0.44–1.00)
Calcium: 8.4 mg/dL — ABNORMAL LOW (ref 8.9–10.3)
Chloride: 94 mmol/L — ABNORMAL LOW (ref 101–111)
Chloride: 96 mmol/L — ABNORMAL LOW (ref 101–111)
Chloride: 94 mmol/L — ABNORMAL LOW (ref 101–111)
GFR calc Af Amer: >60 mL/min (ref >60)
GFR calc Af Amer: 56 mL/min — ABNORMAL LOW (ref >60)
GFR calc non Af Amer: 44 mL/min — ABNORMAL LOW (ref >60)
GFR calc non Af Amer: 48 mL/min — ABNORMAL LOW (ref >60)
GFR, EST AFRICAN AMERICAN: 51 mL/min — AB (ref >60)
GFR, EST AFRICAN AMERICAN: 52 mL/min — AB (ref >60)
GFR, EST NON AFRICAN AMERICAN: 52 mL/min — AB (ref >60)
GFR, EST NON AFRICAN AMERICAN: 45 mL/min — AB (ref >60)
GLUCOSE: 194 mg/dL — AB (ref 65–99)
Glucose, Bld: 95 mg/dL (ref 65–99)
Glucose, Bld: 152 mg/dL — ABNORMAL HIGH (ref 65–99)
Glucose, Bld: 161 mg/dL — ABNORMAL HIGH (ref 65–99)
POTASSIUM: 4.4 mmol/L (ref 3.5–5.1)
Potassium: 4.6 mmol/L (ref 3.5–5.1)
Potassium: 4.6 mmol/L (ref 3.5–5.1)
Potassium: 4.5 mmol/L (ref 3.5–5.1)
SODIUM: 131 mmol/L — AB (ref 135–145)
SODIUM: 130 mmol/L — AB (ref 135–145)
Sodium: 129 mmol/L — ABNORMAL LOW (ref 135–145)
Sodium: 129 mmol/L — ABNORMAL LOW (ref 135–145)

## 2017-11-29 LAB — CBC WITH DIFFERENTIAL/PLATELET
BASOS PCT: 1 %
Basophils Absolute: 0.1 10*3/uL (ref 0.0–0.1)
EOS ABS: 0.2 10*3/uL (ref 0.0–0.7)
EOS PCT: 3 %
HCT: 35.3 % — ABNORMAL LOW (ref 36.0–46.0)
HEMOGLOBIN: 12 g/dL (ref 12.0–15.0)
Lymphocytes Relative: 18 %
Lymphs Abs: 1.4 10*3/uL (ref 0.7–4.0)
MCH: 29.9 pg (ref 26.0–34.0)
MCHC: 34 g/dL (ref 30.0–36.0)
MCV: 87.8 fL (ref 78.0–100.0)
Monocytes Absolute: 0.8 10*3/uL (ref 0.1–1.0)
Monocytes Relative: 11 %
NEUTROS PCT: 67 %
Neutro Abs: 5.1 10*3/uL (ref 1.7–7.7)
PLATELETS: 314 10*3/uL (ref 150–400)
RBC: 4.02 MIL/uL (ref 3.87–5.11)
RDW: 13.2 % (ref 11.5–15.5)
WBC: 7.6 10*3/uL (ref 4.0–10.5)

## 2017-11-29 LAB — URINALYSIS, ROUTINE W REFLEX MICROSCOPIC
BILIRUBIN URINE: NEGATIVE
Bacteria, UA: NONE SEEN
GLUCOSE, UA: NEGATIVE mg/dL
HGB URINE DIPSTICK: NEGATIVE
KETONES UR: NEGATIVE mg/dL
NITRITE: NEGATIVE
PH: 7 (ref 5.0–8.0)
Protein, ur: NEGATIVE mg/dL
SPECIFIC GRAVITY, URINE: 1.008 (ref 1.005–1.030)

## 2017-11-29 LAB — HEMOGLOBIN A1C
Hgb A1c MFr Bld: 5.8 % — ABNORMAL HIGH (ref 4.8–5.6)
Mean Plasma Glucose: 119.76 mg/dL

## 2017-11-29 LAB — SODIUM, URINE, RANDOM: SODIUM UR: 109 mmol/L

## 2017-11-29 LAB — OSMOLALITY, URINE: OSMOLALITY UR: 353 mosm/kg (ref 300–900)

## 2017-11-29 LAB — AMMONIA: Ammonia: 29 umol/L (ref 9–35)

## 2017-11-29 MED ORDER — MORPHINE SULFATE (PF) 2 MG/ML IV SOLN
2.0000 mg | INTRAVENOUS | Status: DC | PRN
  Administered 2017-11-29: 2 mg via INTRAVENOUS
  Filled 2017-11-29: qty 1

## 2017-11-29 MED ORDER — DIVALPROEX SODIUM 500 MG PO DR TAB
500.0000 mg | DELAYED_RELEASE_TABLET | Freq: Two times a day (BID) | ORAL | Status: DC
  Administered 2017-11-30 – 2017-12-01 (×3): 500 mg via ORAL
  Filled 2017-11-29 (×5): qty 1

## 2017-11-29 MED ORDER — VALPROATE SODIUM 500 MG/5ML IV SOLN
1500.0000 mg | Freq: Once | INTRAVENOUS | Status: AC
  Administered 2017-11-30: 1500 mg via INTRAVENOUS
  Filled 2017-11-29: qty 15

## 2017-11-29 NOTE — Procedures (Signed)
ELECTROENCEPHALOGRAM REPORT  Date of Study: 11/29/17  Patient's Name: Bonnie Cook MRN: [id number] Date of Birth: Feb 26, 1951  Referring Provider: Niel Hummer, MD  Clinical History: Bonnie Cook is a 67 y.o.  F with multiple episodes of transient neurological dysfunction (aphasia, right side numbness) with headache.  This time, patient had started to have headache 11/19/17. TookFioricet with improvement. However last Friday started to have confusion, found to have UTI, was put on Cipro. Second day, patient again had a headache with confusion, not able to know how to unlock the door, how to dial the phone. 2 days ago, patient again had severe migraine went to urgent care, received steroids Benadryl and Toradol shots. Headache improved some however seems confusion getting worse over time. Yesterday morning she was found not able to recognize her daughter, and also with some word finding difficulties. Daughter decided to bring patient to [facility] ER for further evaluation.  In hospital, she was found to have low sodium level Na 123.  Prior Head MRI w/ old Lt BG lacune, and prior EEG's have been either normal, or only showed Lt temporal slowing.       Medications: Scheduled Meds: . aspirin EC  81 mg Oral Daily  . citalopram  20 mg Oral Daily  . colestipol  1 g Oral BID  . enoxaparin (LOVENOX) injection  40 mg Subcutaneous Q24H  . loratadine  10 mg Oral Daily  . meloxicam  15 mg Oral Daily  . multivitamin with minerals  1 tablet Oral Daily  . sodium chloride flush  3 mL Intravenous Q12H  . sodium chloride flush  3 mL Intravenous Q12H   Continuous Infusions: . sodium chloride     PRN Meds:.sodium chloride, acetaminophen **OR** acetaminophen, albuterol, HYDROcodone-acetaminophen, ketorolac, morphine injection, ondansetron **OR** ondansetron (ZOFRAN) IV, senna-docusate, sodium chloride flush            Technical Summary: This is a standard 16 channel EEG recording performed  according to the international 10-20 electrode system.  AP bipolar, transverse bipolar, and referential montages were obtained, and digitally reformatted as necessary.  Duration of tracing: 23:02  Description: The patient is described as being awake, drowsy, and confused.   Background consists of low amplitude, disorganized 5-6 Hz theta, with superimposed faster beta activity.  The predominant feature of this tracing are multiple episodes of paroxysmal, frontally predominant, high amplitude generalized slowing, quickly followed by generalized fast activity that transforms into sharply contoured 5-6 Hz generalized rhythmic activity that at times has a spike/polyspike appearance, before gradually transforming into a low amplitude, polymorphic theta activity.  These episodes generally last 20 seconds, with intervening periods of relative low, fast baseline activity that may last 10 seconds, during which time there is often assymetric residual faster activity from the right hemisphere.  Other than some minor head movement, there is no clinical correlate to these episodes, and the patient is able to continue to interact with the technician, although slightly confused.  Neither HV or photic stimulation were performed.  EKG was monitored throughout the tracing and noted to be sinus rhythm at 60 bpm.  Impression: This is an abnormal EEG.  Multiple episodes of paroxysmal generalized fast activity, with susbsequent generalized rhythmic slowing, were seen throughout the tracing which could imply the possibility of non-convulsive, electrographic seizures.  At times, the faster activity seemed to have a right hemispheric predominance.  There were no definite clinical manifestations for the episodes, however clinical correlation is advised in regards to ongoing use of  anticonvulsant agents, or possibly long term video EEG monitoring.  I was able to discuss this EEG report with Forrest Moron, NP (provider on call).    Carvel Getting, M.D. Neurology Cell [phone]

## 2017-11-29 NOTE — Progress Notes (Signed)
RN received call from neurologist, Dr. Alvino Blood, requesting provider caring for patient tonight call him on cell number to discuss EEG results.  RN paged Tylene Fantasia, NP and provided cell number for her to call MD as requested. P.J. Linus Mako, RN

## 2017-11-29 NOTE — Progress Notes (Signed)
PROGRESS NOTE    Bonnie Cook  UXN:[id number] DOB: Jan 19, 1951 DOA: 11/28/2017 PCP: Myrlene Broker, MD    Brief Narrative: Bonnie Cook is a 67 y.o. female with medical history significant for chronic migraines, type 2 diabetes mellitus, and hypertension, now presenting to the emergency department for evaluation of confusion.  Patient reportedly developed confusion with her migraine headaches frequently.  She has been experiencing worsening headaches recently and was started on Depakote by her neurologist.  She had a severe migraine that was treated a couple days ago at urgent care with Toradol and Benadryl injections.  Headache has since resolved, but her confusion has continued to worsen.  She was recently diagnosed with UTI and has been treated with antibiotics.  She denies any fevers or flank pain.  Denies fall or trauma.    ED Course: Upon arrival to the ED, patient is found to be afebrile, saturating well on room air, and was otherwise normal.  Noncontrast head CT is negative for acute intracranial abnormality.  Chemistry panel features a sodium of 123, normal 6 weeks ago.  CBC features a leukocytosis to 12,000.  Patient was treated with a liter of normal saline in the ED she remains hemodynamically stable, in no apparent respiratory distress, but is slightly confused, likely secondary to hyponatremia, and will be admitted to the hospital for ongoing evaluation and management of this    Assessment & Plan:   Principal Problem:   Hyponatremia Active Problems:   Essential hypertension   Diabetes mellitus without complication ([place])   History of stroke   Acute metabolic encephalopathy   Chronic migraine   UTI (urinary tract infection)   1-Hyponatremia;  -Urine osmolality 353, urine sodium 114, consistent with SIADH.  -Agree with stopping Depakote  -Improving.  -Continue with water restriction.  -hold lisinopril.   2-Acute encephalopathy; this could be multifactorial, related to  hyponatremia, also she was on cipr that has been related to confusion in the elderly. Also recent infection.  -Discussed with Dr Erlinda Hong, will get EEG and MRI and he will see patient in consultation.   3-Recent UTI. She has received 5 days of cipr. Repeated urine culture pending.    DVT prophylaxis:Lovenox Code Status: Full code.  Family Communication: care discussed with patient 's daughter  Disposition Plan: remain in patient, not back to baseline regarding confusion, need to monitor sodium level closely, also need further testing; MRI and EEG>   Consultants:   Neurology    Procedures:   EEG;   Antimicrobials:    Subjective: She is alert, still confuse, not oriented to situation and year.  She was able to recognized daughter   Objective: Vitals:   11/28/17 2100 11/28/17 2230 11/28/17 2304 11/29/17 0640  BP: 138/73 116/81 (!) 134/94 111/69  Pulse: 73 70 72 71  Resp: 19 20  18   Temp:   97.8 F (36.6 C) 98.1 F (36.7 C)  TempSrc:   Oral Oral  SpO2: 98% 97% 98% 98%  Weight:    76.3 kg (168 lb 3.4 oz)  Height:        Intake/Output Summary (Last 24 hours) at 11/29/2017 0853 Last data filed at 11/29/2017 0200 Gross per 24 hour  Intake 1000 ml  Output 200 ml  Net 800 ml   Filed Weights   11/28/17 1752 11/29/17 0640  Weight: 73.9 kg (163 lb) 76.3 kg (168 lb 3.4 oz)    Examination:  General exam: Appears calm and comfortable  Respiratory system: Clear to auscultation.  Respiratory effort normal. Cardiovascular system: S1 & S2 heard, RRR. No JVD, murmurs, rubs, gallops or clicks. No pedal edema. Gastrointestinal system: Abdomen is nondistended, soft and nontender. No organomegaly or masses felt. Normal bowel sounds heard. Central nervous system: Alert and confuse. . No focal neurological deficits. Extremities: Symmetric 5 x 5 power. Skin: No rashes, lesions or ulcers     Data Reviewed: I have personally reviewed following labs and imaging studies  CBC: Recent  Labs  Lab 11/28/17 1801  WBC 12.0*  HGB 13.1  HCT 37.8  MCV 87.7  PLT 397   Basic Metabolic Panel: Recent Labs  Lab 11/28/17 1801 11/28/17 2056 11/29/17 0512  NA 123* 129* 129*  K 4.4 4.2 4.6  CL 88* 96* 94*  CO2 23 22 24   GLUCOSE 106* 94 95  BUN 18 16 15   CREATININE 1.04* 0.92 1.07*  CALCIUM 8.9 8.2* 8.2*   GFR: Estimated Creatinine Clearance: 49.3 mL/min (A) (by C-G formula based on SCr of 1.07 mg/dL (H)). Liver Function Tests: Recent Labs  Lab 11/28/17 1801  AST 32  ALT 25  ALKPHOS 62  BILITOT 0.5  PROT 6.3*  ALBUMIN 3.4*   No results for input(s): LIPASE, AMYLASE in the last 168 hours. No results for input(s): AMMONIA in the last 168 hours. Coagulation Profile: No results for input(s): INR, PROTIME in the last 168 hours. Cardiac Enzymes: No results for input(s): CKTOTAL, CKMB, CKMBINDEX, TROPONINI in the last 168 hours. BNP (last 3 results) No results for input(s): PROBNP in the last 8760 hours. HbA1C: No results for input(s): HGBA1C in the last 72 hours. CBG: Recent Labs  Lab 11/28/17 1800  GLUCAP 103*   Lipid Profile: No results for input(s): CHOL, HDL, LDLCALC, TRIG, CHOLHDL, LDLDIRECT in the last 72 hours. Thyroid Function Tests: No results for input(s): TSH, T4TOTAL, FREET4, T3FREE, THYROIDAB in the last 72 hours. Anemia Panel: No results for input(s): VITAMINB12, FOLATE, FERRITIN, TIBC, IRON, RETICCTPCT in the last 72 hours. Sepsis Labs: No results for input(s): PROCALCITON, LATICACIDVEN in the last 168 hours.  No results found for this or any previous visit (from the past 240 hour(s)).       Radiology Studies: Ct Head Wo Contrast  Result Date: 11/28/2017 CLINICAL DATA:  Migraine last week. Recent UTI. Altered mental status. EXAM: CT HEAD WITHOUT CONTRAST TECHNIQUE: Contiguous axial images were obtained from the base of the skull through the vertex without intravenous contrast. COMPARISON:  10/17/2017 FINDINGS: Brain: Ventricles,  cisterns and other CSF spaces are within normal. There is chronic ischemic microvascular disease. Old lacune infarct over the left lentiform nucleus/internal capsule. No evidence of mass, mass effect, shift of midline structures or acute hemorrhage. No evidence of acute infarction. Vascular: No hyperdense vessel or unexpected calcification. Skull: Normal. Negative for fracture or focal lesion. Sinuses/Orbits: No acute finding. Other: None. IMPRESSION: No acute findings. Chronic ischemic microvascular disease. Old left basal ganglia lacunar infarct. Electronically Signed   By: Marin Olp M.D.   On: 11/28/2017 19:44        Scheduled Meds: . aspirin EC  81 mg Oral Daily  . ciprofloxacin  500 mg Oral BID  . citalopram  20 mg Oral Daily  . colestipol  1 g Oral BID  . enoxaparin (LOVENOX) injection  40 mg Subcutaneous Q24H  . lisinopril  10 mg Oral QPM  . loratadine  10 mg Oral Daily  . meloxicam  15 mg Oral Daily  . multivitamin with minerals  1 tablet Oral Daily  .  sodium chloride flush  3 mL Intravenous Q12H  . sodium chloride flush  3 mL Intravenous Q12H   Continuous Infusions: . sodium chloride       LOS: 1 day    Time spent: 35 minutes.     Elmarie Shiley, MD [facility] Pager [phone]  If 7PM-7AM, please contact night-coverage [email or web address] Password [facility] 11/29/2017, 8:53 AM

## 2017-11-29 NOTE — Progress Notes (Signed)
Dr. Alvino Blood (EEG reader) called with abnormal EEG report reading. States in the past, pt's EEGs have showed slowing only and tonight it  is positive for multiple episodes of buildup to fast activity. Recommends long term EEG monitoring. This NP called our neuro on call tonight and gave him report and number for above Dr to speak directly to that neurologist who read the EEG.  KJKG, NP [facility]

## 2017-11-29 NOTE — Consult Note (Signed)
Stroke Neurology Consultation Note  Consult Requested by: Dr. Tyrell Antonio  Reason for Consult: confusion, HA and word finding difficulty   Consult Date: 11/29/17  The history was obtained from the pt and daughter.  During history and examination, all items was able to obtained unless otherwise noted.  History of Present Illness:  Bonnie Cook is a 67 y.o. Caucasian female with PMH of HTN, DM, HLD and complicated migarineadmitted for above symptoms.   She was admitted on 01/22/17 for expressive aphasia, nausea, brief right hand numbness followed by HA. CT no acute abnormality and MRI negative for acute stroke but old left BG lacune. MRA, CUS, TTE unremarkable. LDL 132 and A1C 5.6. EEG showed left temporal slowing but no seizure. Her symptoms resolved, but concern for complicated migraine vs. Seizure. Continued on ASA on discharge. After discharge, she had three more episode of HA, one associated with left hand and left leg weakness. HA resolved after sleep or fioricet. She denies photo and phono phobia, no N/V, migraine history, but her daughter and uncle have migraine. ESR and CRP WNL and repeat EEG was normal.  Admitted again on 10/17/17 for 2 episodes of HA and right sided numbness with aphasia. tPA given for code stroke in ER. MRI no acute stroke. HA and aphasia resolved overnight in ICU. EEG again showed left temporal slowing. Put on depakote ER 514m Qhs on discharge.   This time, patient had started to have headache 11/19/17.  Took Fioricet with improvement.  However last Friday started to have confusion, found to have UTI, was put on Cipro.  Second day, patient again had a headache with confusion, not able to know how to unlock the door, how to dial the phone.  2 days ago, patient again had severe migraine went to urgent care, received steroids Benadryl and Toradol shots.  Headache improved some however seems confusion getting worse over time. Yesterday morning she was found not able to recognize  her daughter, and also with some word finding difficulties.  [facility].  In hospital, she was found to have low sodium level Na 123. However, her Na was normal last admission. Her Cre also elevated. WBC 12.0. UA WBC 6-30. Cipro discontinued. Pending MRI and EEG.    Her LDL still high, but not able to tolerate statins in the past including lipitor, crestor, zocor, and pravastatin. Plan for repatha, but not started yet.    Past Medical History:  Diagnosis Date  . Asthma   [place]   . Headache   . Hypercholesteremia   . Hypertension     Past Surgical History:  Procedure Laterality Date  . BACK SURGERY    . HAND SURGERY  2014    Family History  Problem Relation Age of Onset  . Transient ischemic attack Mother   . Alzheimer's disease Mother   . Cancer Mother   . Heart disease Father   . Stroke Maternal Grandmother   . Migraines Daughter     Social History:  reports that  has never smoked. she has never used smokeless tobacco. She reports that she does not drink alcohol or use drugs.  Allergies:  Allergies  Allergen Reactions  . Erythromycin Base Other (See Comments)    GI Upset  . Penicillins Rash    From childhood: Has patient had a PCN reaction causing immediate rash, facial/tongue/throat swelling, SOB or lightheadedness with hypotension: Yes Has patient had a  PCN reaction causing severe rash involving mucus membranes or skin necrosis: Unk Has patient had a PCN reaction that required hospitalization: Unk Has patient had a PCN reaction occurring within the last 10 years: No If all of the above answers are "NO", then may proceed with Cephalosporin use.      No current facility-administered medications on file prior to encounter.    Current Outpatient Medications on File Prior to Encounter  Medication Sig Dispense Refill  . albuterol (PROAIR HFA) 108 (90 Base) MCG/ACT  inhaler Inhale 1-2 puffs into the lungs every 6 hours as needed for shortness of breath or wheezing    . aspirin EC 81 MG tablet Take 81 mg by mouth daily.    . ciprofloxacin (CIPRO) 500 MG tablet Take 500 mg by mouth 2 (two) times daily. FOR 7 DAYS  0  . citalopram (CELEXA) 20 MG tablet Take 20 mg by mouth daily.    . colestipol (COLESTID) 1 g tablet Take 1 g by mouth 2 (two) times daily.    Marland Kitchen CRANBERRY PO Take 4 tablets by mouth daily.     . cyanocobalamin (,VITAMIN B-12,) 1000 MCG/ML injection Inject 1,000 mcg into the muscle every 30 (thirty) days.   1  . divalproex (DEPAKOTE ER) 500 MG 24 hr tablet Take 1 tablet (500 mg total) by mouth at bedtime. 30 tablet 3  . Evolocumab (REPATHA) 140 MG/ML SOSY Inject 140 mg into the skin every 14 (fourteen) days. 2 mL 5  . lisinopril (PRINIVIL,ZESTRIL) 10 MG tablet Take 10 mg by mouth every evening.    . loratadine (CLARITIN) 10 MG tablet Take 10 mg by mouth daily.    . meloxicam (MOBIC) 15 MG tablet Take 15 mg by mouth daily.    . metFORMIN (GLUCOPHAGE) 850 MG tablet Take 850 mg by mouth 2 (two) times daily.    . ondansetron (ZOFRAN-ODT) 4 MG disintegrating tablet Take 4 mg by mouth 2 (two) times daily as needed for nausea/vomiting.    . Vitamin D, Ergocalciferol, (DRISDOL) 50000 units CAPS capsule Take 50,000 Units by mouth 2 (two) times a week. On Monday and Thursday  3  . Multiple Vitamins-Minerals (MULTIVITAMIN ADULT) CHEW Chew 2 tablets by mouth daily.      Review of Systems: A full ROS was attempted today and was able to be performed.  Systems assessed include - Constitutional, Eyes,  HENT, Respiratory, Cardiovascular, Gastrointestinal, Genitourinary, Integument/breast, Hematologic/lymphatic, Musculoskeletal, Neurological, Behavioral/Psych, Endocrine, Allergic/Immunologic - with pertinent responses as per HPI.  Physical Examination: Temp:  [97.8 F (36.6 C)-98.7 F (37.1 C)] 98.1 F (36.7 C) (01/16 0640) Pulse Rate:  [58-91] 71 (01/16  0640) Resp:  [14-25] 18 (01/16 0640) BP: (111-148)/(58-94) 111/69 (01/16 0640) SpO2:  [94 %-100 %] 98 % (01/16 0640) Weight:  [163 lb (73.9 kg)-168 lb 3.4 oz (76.3 kg)] 168 lb 3.4 oz (76.3 kg) (01/16 0640)  General - well nourished, well developed, in no apparent distress.    Ophthalmologic - fundi not visualized due to noncooperation.    Cardiovascular - regular rate and rhythm  Mental Status -  Level of arousal and orientation to place, and person were intact, not orientated to time. Language including expression, naming, repetition, comprehension, reading, and writing was assessed and found intact. Attention span and concentration were impaired, not able to backward spelling or calculate. Fund of Knowledge was assessed and was intact.  Cranial Nerves II - XII - II - Vision intact OU. III, IV, VI - Extraocular movements intact. V -  Facial sensation intact bilaterally. VII - Facial movement intact bilaterally. VIII - Hearing & vestibular intact bilaterally. X - Palate elevates symmetrically. XI - Chin turning & shoulder shrug intact bilaterally. XII - Tongue protrusion intact.  Motor Strength - The patient's strength was normal in all extremities and pronator drift was absent.   Motor Tone & Bulk - Muscle tone was assessed at the neck and appendages and was normal.  Bulk was normal and fasciculations were absent.   Reflexes - The patient's reflexes were normal in all extremities and she had no pathological reflexes.  Sensory - Light touch, temperature/pinprick were assessed and were normal.    Coordination - The patient had normal movements in the hands with no ataxia or dysmetria.  Tremor was absent.  Gait and Station - deferred  Data Reviewed: Ct Head Wo Contrast  Result Date: 11/28/2017 CLINICAL DATA:  Migraine last week. Recent UTI. Altered mental status. EXAM: CT HEAD WITHOUT CONTRAST TECHNIQUE: Contiguous axial images were obtained from the base of the skull through  the vertex without intravenous contrast. COMPARISON:  10/17/2017 FINDINGS: Brain: Ventricles, cisterns and other CSF spaces are within normal. There is chronic ischemic microvascular disease. Old lacune infarct over the left lentiform nucleus/internal capsule. No evidence of mass, mass effect, shift of midline structures or acute hemorrhage. No evidence of acute infarction. Vascular: No hyperdense vessel or unexpected calcification. Skull: Normal. Negative for fracture or focal lesion. Sinuses/Orbits: No acute finding. Other: None. IMPRESSION: No acute findings. Chronic ischemic microvascular disease. Old left basal ganglia lacunar infarct. Electronically Signed   By: Marin Olp M.D.   On: 11/28/2017 19:44   MRI pending  EEG pending   Assessment: 67 y.o. female with PMH of HTN, DM, HLD and complicated migarineadmitted for confusion, HA and word finding difficulty. She was admitted twice in the past for HA and expressive aphasia, considered complicated migraine. On depakoete at home. EEG left temporal slowing but no seizure. She was found to have low sodium level Na 123. However, her Na was normal last admission in 10/2017. Her Cre also elevated. WBC 12.0. UA WBC 6-30. Cipro discontinued. Her condition most likely encephalopathy due to low Na level. Pt still has mild disorientation with decreased concentraion and attention, but improved from yesterday. Pending MRI and EEG, ammonia level.   Plan: - MRI to rule to stroke - EEG to rule out seizure given low Na - hyponatremia treatment per primary team, now Na getting better - continue ASA  - she was started on depakote more than a month ago. Currently depakote on hold. As per uptodate, hyponatremia happens < 1% with depakote.  - depakote level 34, no depakote toxicity. Pending ammonia.  - will follow.   Thank you for this consultation and allowing [place] to participate in the care of this patient.  Rosalin Hawking, MD PhD Stroke  Neurology 11/29/2017 10:52 AM

## 2017-11-29 NOTE — Progress Notes (Signed)
EEG completed, results pending. 

## 2017-11-29 NOTE — Progress Notes (Signed)
At about midnight, pt had a migraine event, and was nauseated. IV Toradol and Zofran given. Pt threw up the PO Oxy, and NP on call was notified for another IV pain med. Morphine IV was ordered, medication was given and patient reported some relief. Pt is currently asleep. Will continue to monitor.

## 2017-11-30 ENCOUNTER — Inpatient Hospital Stay (HOSPITAL_COMMUNITY): Payer: PPO

## 2017-11-30 DIAGNOSIS — R4189 Other symptoms and signs involving cognitive functions and awareness: Secondary | ICD-10-CM

## 2017-11-30 DIAGNOSIS — R569 Unspecified convulsions: Secondary | ICD-10-CM

## 2017-11-30 LAB — BASIC METABOLIC PANEL
Anion gap: 12 (ref 5–15)
BUN: 13 mg/dL (ref 6–20)
CALCIUM: 8.7 mg/dL — AB (ref 8.9–10.3)
CO2: 23 mmol/L (ref 22–32)
Chloride: 97 mmol/L — ABNORMAL LOW (ref 101–111)
Creatinine, Ser: 1.14 mg/dL — ABNORMAL HIGH (ref 0.44–1.00)
GFR calc non Af Amer: 48 mL/min — ABNORMAL LOW (ref >60)
GFR, EST AFRICAN AMERICAN: 56 mL/min — AB (ref >60)
Glucose, Bld: 96 mg/dL (ref 65–99)
Potassium: 4.6 mmol/L (ref 3.5–5.1)
SODIUM: 132 mmol/L — AB (ref 135–145)

## 2017-11-30 LAB — URINE CULTURE

## 2017-11-30 LAB — VITAMIN B12: Vitamin B-12: 1182 pg/mL — ABNORMAL HIGH (ref 180–914)

## 2017-11-30 LAB — TSH: TSH: 2.636 u[IU]/mL (ref 0.350–4.500)

## 2017-11-30 MED ORDER — LORAZEPAM 2 MG/ML IJ SOLN
2.0000 mg | Freq: Once | INTRAMUSCULAR | Status: AC
  Administered 2017-11-30: 2 mg via INTRAVENOUS
  Filled 2017-11-30: qty 1

## 2017-11-30 MED ORDER — GADOBENATE DIMEGLUMINE 529 MG/ML IV SOLN
15.0000 mL | Freq: Once | INTRAVENOUS | Status: AC
  Administered 2017-11-30: 15 mL via INTRAVENOUS

## 2017-11-30 NOTE — Progress Notes (Addendum)
PROGRESS NOTE    Bonnie Cook  IOX:[id number] DOB: 1951/04/03 DOA: 11/28/2017 PCP: Myrlene Broker, MD    Brief Narrative: Bonnie Cook is a 67 y.o. female with medical history significant for chronic migraines, type 2 diabetes mellitus, and hypertension, now presenting to the emergency department for evaluation of confusion.  Patient reportedly developed confusion with her migraine headaches frequently.  She has been experiencing worsening headaches recently and was started on Depakote by her neurologist.  She had a severe migraine that was treated a couple days ago at urgent care with Toradol and Benadryl injections.  Headache has since resolved, but her confusion has continued to worsen.  She was recently diagnosed with UTI and has been treated with antibiotics.  She denies any fevers or flank pain.  Denies fall or trauma.    ED Course: Upon arrival to the ED, patient is found to be afebrile, saturating well on room air, and was otherwise normal.  Noncontrast head CT is negative for acute intracranial abnormality.  Chemistry panel features a sodium of 123, normal 6 weeks ago.  CBC features a leukocytosis to 12,000.  Patient was treated with a liter of normal saline in the ED she remains hemodynamically stable, in no apparent respiratory distress, but is slightly confused, likely secondary to hyponatremia, and will be admitted to the hospital for ongoing evaluation and management of this    Assessment & Plan:   Principal Problem:   Hyponatremia Active Problems:   Essential hypertension   Diabetes mellitus without complication ([place])   History of stroke   Acute metabolic encephalopathy   Chronic migraine   UTI (urinary tract infection)   1-Hyponatremia; SIADH -Urine osmolality 353, urine sodium 114, consistent with SIADH.  -Continue with water restriction.  -hold lisinopril.  Sodium improved to 132, confusion persist. Would expect for confusion to be better with correction of  hyponatremia.  -monitor sodium level daily. Patient back on Depakote.   2-Acute encephalopathy; now considering seizure. EEG abnormal with multiples non convulsive spikes.  Episode of worsening confusion this am. Over time she improved.  MRI brain negative , no acute finding.  Appreciate Dr Erlinda Hong evaluation.  Patient was loaded with depakote last night.  I will check TSH, B 12, thiamine, RPR>   3-Recent UTI. She has received 5 days of cipr. Repeated urine culture ; insignificant growth.    DVT prophylaxis:Lovenox Code Status: Full code.  Family Communication: care discussed with patient 's daughter  Disposition Plan: remain in patient, not back to baseline regarding confusion, need to monitor sodium level closely, also need further testing; MRI and EEG>   Consultants:   Neurology    Procedures:   EEG;   Antimicrobials:    Subjective: Patient was not following command this am, more confused. I came to see patient, and patient was more alert per nurse. Patient follow commands, answer questions. Still not oriented to situation and year as yesterday.    Objective: Vitals:   11/29/17 2220 11/30/17 0415 11/30/17 0525 11/30/17 0825  BP: 119/70  137/79 (!) 146/69  Pulse: 71  63   Resp: 18  18 18   Temp: 97.8 F (36.6 C)  98.2 F (36.8 C) 98.2 F (36.8 C)  TempSrc:   Oral Oral  SpO2: 95%  94% 95%  Weight:  75.7 kg (166 lb 12.8 oz)    Height:        Intake/Output Summary (Last 24 hours) at 11/30/2017 0845 Last data filed at 11/30/2017 0600 Gross per  24 hour  Intake 580 ml  Output 700 ml  Net -120 ml   Filed Weights   11/28/17 1752 11/29/17 0640 11/30/17 0415  Weight: 73.9 kg (163 lb) 76.3 kg (168 lb 3.4 oz) 75.7 kg (166 lb 12.8 oz)    Examination:  General exam: NAD Respiratory system: CTA Cardiovascular system: S 1, S 2 RRR Gastrointestinal system: BS present, soft, nt Central nervous system: alert, confuse, no focal deficit.  Extremities: Symmetric 5 x 5  power. Skin: No rashes, lesions or ulcers     Data Reviewed: I have personally reviewed following labs and imaging studies  CBC: Recent Labs  Lab 11/28/17 1801 11/29/17 0916  WBC 12.0* 7.6  NEUTROABS  --  5.1  HGB 13.1 12.0  HCT 37.8 35.3*  MCV 87.7 87.8  PLT 349 176   Basic Metabolic Panel: Recent Labs  Lab 11/29/17 0512 11/29/17 0916 11/29/17 1420 11/29/17 2029 11/30/17 0529  NA 129* 131* 129* 130* 132*  K 4.6 4.6 4.5 4.4 4.6  CL 94* 96* 94* 95* 97*  CO2 24 24 24 23 23   GLUCOSE 95 152* 194* 161* 96  BUN 15 16 16 15 13   CREATININE 1.07* 1.23* 1.21* 1.14* 1.14*  CALCIUM 8.2* 8.4* 8.3* 8.4* 8.7*   GFR: Estimated Creatinine Clearance: 46 mL/min (A) (by C-G formula based on SCr of 1.14 mg/dL (H)). Liver Function Tests: Recent Labs  Lab 11/28/17 1801  AST 32  ALT 25  ALKPHOS 62  BILITOT 0.5  PROT 6.3*  ALBUMIN 3.4*   No results for input(s): LIPASE, AMYLASE in the last 168 hours. Recent Labs  Lab 11/29/17 1051  AMMONIA 29   Coagulation Profile: No results for input(s): INR, PROTIME in the last 168 hours. Cardiac Enzymes: No results for input(s): CKTOTAL, CKMB, CKMBINDEX, TROPONINI in the last 168 hours. BNP (last 3 results) No results for input(s): PROBNP in the last 8760 hours. HbA1C: Recent Labs    11/29/17 1051  HGBA1C 5.8*   CBG: Recent Labs  Lab 11/28/17 1800  GLUCAP 103*   Lipid Profile: No results for input(s): CHOL, HDL, LDLCALC, TRIG, CHOLHDL, LDLDIRECT in the last 72 hours. Thyroid Function Tests: No results for input(s): TSH, T4TOTAL, FREET4, T3FREE, THYROIDAB in the last 72 hours. Anemia Panel: No results for input(s): VITAMINB12, FOLATE, FERRITIN, TIBC, IRON, RETICCTPCT in the last 72 hours. Sepsis Labs: No results for input(s): PROCALCITON, LATICACIDVEN in the last 168 hours.  Recent Results (from the past 240 hour(s))  Urine culture     Status: Abnormal   Collection Time: 11/29/17  1:57 AM  Result Value Ref Range Status    Specimen Description URINE, CLEAN CATCH  Final   Special Requests NONE  Final   Culture <10,000 COLONIES/mL INSIGNIFICANT GROWTH (A)  Final   Report Status 11/30/2017 FINAL  Final         Radiology Studies: Ct Head Wo Contrast  Result Date: 11/28/2017 CLINICAL DATA:  Migraine last week. Recent UTI. Altered mental status. EXAM: CT HEAD WITHOUT CONTRAST TECHNIQUE: Contiguous axial images were obtained from the base of the skull through the vertex without intravenous contrast. COMPARISON:  10/17/2017 FINDINGS: Brain: Ventricles, cisterns and other CSF spaces are within normal. There is chronic ischemic microvascular disease. Old lacune infarct over the left lentiform nucleus/internal capsule. No evidence of mass, mass effect, shift of midline structures or acute hemorrhage. No evidence of acute infarction. Vascular: No hyperdense vessel or unexpected calcification. Skull: Normal. Negative for fracture or focal lesion. Sinuses/Orbits: No acute  finding. Other: None. IMPRESSION: No acute findings. Chronic ischemic microvascular disease. Old left basal ganglia lacunar infarct. Electronically Signed   By: Marin Olp M.D.   On: 11/28/2017 19:44        Scheduled Meds: . aspirin EC  81 mg Oral Daily  . citalopram  20 mg Oral Daily  . colestipol  1 g Oral BID  . divalproex  500 mg Oral Q12H  . enoxaparin (LOVENOX) injection  40 mg Subcutaneous Q24H  . loratadine  10 mg Oral Daily  . meloxicam  15 mg Oral Daily  . multivitamin with minerals  1 tablet Oral Daily  . sodium chloride flush  3 mL Intravenous Q12H  . sodium chloride flush  3 mL Intravenous Q12H   Continuous Infusions: . sodium chloride       LOS: 2 days    Time spent: 35 minutes.     Elmarie Shiley, MD [facility] Pager [phone]  If 7PM-7AM, please contact night-coverage [email or web address] Password [email or web address] 11/30/2017, 8:45 AM

## 2017-11-30 NOTE — Progress Notes (Addendum)
Pt's NA level this am was 132. MD notified.MD also notified of pt being disoriented & not following commands. Pt also noted to be drowsy. Pt did not recognize her dgtr & would not follow commands. hospitalist Regalado,MD & Neurologist Erlinda Hong, MD aware. New orders for stat MRI.  Hoover Brunette, RN

## 2017-11-30 NOTE — Progress Notes (Signed)
STROKE TEAM PROGRESS NOTE   SUBJECTIVE (INTERVAL HISTORY) Her daughter is at the bedside. EEG yesterday showed possible seizure activity and concerning for NCSE. Had depakote loading dose and continued bid.   This am, pt woke up with confusion, not knowing her name and situation. Dr. Tyrell Antonio called, on arrival pt less confused and much better, near baseline. MRI brain with and without contrast negative. Currently sleeping after ativan for MRI. Will put on LTM EEG today.   Low suspicious for CNS infection, had lovenox prophylaxis dose last night 2200. Not able to do LP today. Pt no fever, no leukocytosis, will continue to monitor for today. If needed, will do tomorrow.    OBJECTIVE Temp:  [97.7 F (36.5 C)-98.2 F (36.8 C)] 98.2 F (36.8 C) (01/17 0825) Pulse Rate:  [63-71] 63 (01/17 0525) Cardiac Rhythm: Normal sinus rhythm (01/17 0825) Resp:  [18-23] 18 (01/17 0825) BP: (119-146)/(67-79) 146/69 (01/17 0825) SpO2:  [94 %-96 %] 95 % (01/17 0825) Weight:  [166 lb 12.8 oz (75.7 kg)] 166 lb 12.8 oz (75.7 kg) (01/17 0415)  Recent Labs  Lab 11/28/17 1800  GLUCAP 103*   Recent Labs  Lab 11/29/17 0512 11/29/17 0916 11/29/17 1420 11/29/17 2029 11/30/17 0529  NA 129* 131* 129* 130* 132*  K 4.6 4.6 4.5 4.4 4.6  CL 94* 96* 94* 95* 97*  CO2 24 24 24 23 23   GLUCOSE 95 152* 194* 161* 96  BUN 15 16 16 15 13   CREATININE 1.07* 1.23* 1.21* 1.14* 1.14*  CALCIUM 8.2* 8.4* 8.3* 8.4* 8.7*   Recent Labs  Lab 11/28/17 1801  AST 32  ALT 25  ALKPHOS 62  BILITOT 0.5  PROT 6.3*  ALBUMIN 3.4*   Recent Labs  Lab 11/28/17 1801 11/29/17 0916  WBC 12.0* 7.6  NEUTROABS  --  5.1  HGB 13.1 12.0  HCT 37.8 35.3*  MCV 87.7 87.8  PLT 349 314   No results for input(s): CKTOTAL, CKMB, CKMBINDEX, TROPONINI in the last 168 hours. No results for input(s): LABPROT, INR in the last 72 hours. Recent Labs    11/29/17 0157  COLORURINE STRAW*  LABSPEC 1.008  PHURINE 7.0  GLUCOSEU NEGATIVE   HGBUR NEGATIVE  BILIRUBINUR NEGATIVE  KETONESUR NEGATIVE  PROTEINUR NEGATIVE  NITRITE NEGATIVE  LEUKOCYTESUR SMALL*       Component Value Date/Time   CHOL 186 10/17/2017 0650   TRIG 75 10/17/2017 0650   HDL 70 10/17/2017 0650   CHOLHDL 2.7 10/17/2017 0650   VLDL 15 10/17/2017 0650   LDLCALC 101 (H) 10/17/2017 0650   Lab Results  Component Value Date   HGBA1C 5.8 (H) 11/29/2017   No results found for: LABOPIA, COCAINSCRNUR, LABBENZ, AMPHETMU, THCU, LABBARB  No results for input(s): ETH in the last 168 hours.  I have personally reviewed the radiological images below and agree with the radiology interpretations.  Ct Head Wo Contrast  Result Date: 11/28/2017 CLINICAL DATA:  Migraine last week. Recent UTI. Altered mental status. EXAM: CT HEAD WITHOUT CONTRAST TECHNIQUE: Contiguous axial images were obtained from the base of the skull through the vertex without intravenous contrast. COMPARISON:  10/17/2017 FINDINGS: Brain: Ventricles, cisterns and other CSF spaces are within normal. There is chronic ischemic microvascular disease. Old lacune infarct over the left lentiform nucleus/internal capsule. No evidence of mass, mass effect, shift of midline structures or acute hemorrhage. No evidence of acute infarction. Vascular: No hyperdense vessel or unexpected calcification. Skull: Normal. Negative for fracture or focal lesion. Sinuses/Orbits: No acute finding. Other:  None. IMPRESSION: No acute findings. Chronic ischemic microvascular disease. Old left basal ganglia lacunar infarct. Electronically Signed   By: Marin Olp M.D.   On: 11/28/2017 19:44   Mr Jeri Cos XF Contrast  Result Date: 11/30/2017 CLINICAL DATA:  New onset first time seizure. EXAM: MRI HEAD WITHOUT AND WITH CONTRAST TECHNIQUE: Multiplanar, multiecho pulse sequences of the brain and surrounding structures were obtained without and with intravenous contrast. CONTRAST:  61mL MULTIHANCE GADOBENATE DIMEGLUMINE 529 MG/ML IV SOLN  COMPARISON:  Head CT 11/28/2017.  MRI 10/16/2017 and 01/23/2017. FINDINGS: Brain: Diffusion imaging does not show any acute or subacute infarction. There are extensive chronic small vessel ischemic changes throughout the pons. No focal cerebellar insult. There are old small vessel infarctions affecting the thalami, basal ganglia and the cerebral hemispheric white matter. More extensive old infarctions in the left basal ganglia compared to the right. No large vessel territory infarction. No mass lesion, hemorrhage, hydrocephalus or extra-axial collection. Vascular: Major vessels at the base of the brain show flow. Skull and upper cervical spine: Negative Sinuses/Orbits: Clear/normal Other: None IMPRESSION: No acute finding by MRI. No specific cause of seizure identified. Extensive chronic small-vessel ischemic changes throughout the brain as outlined above, similar to the study of last year. Electronically Signed   By: Nelson Chimes M.D.   On: 11/30/2017 11:27   EEG -  This is an abnormal EEG.  Multiple episodes of paroxysmal generalized fast activity, with susbsequent generalized rhythmic slowing, were seen throughout the tracing which could imply the possibility of non-convulsive, electrographic seizures.  At times, the faster activity seemed to have a right hemispheric predominance.  There were no definite clinical manifestations for the episodes, however clinical correlation is advised in regards to ongoing use of anticonvulsant agents, or possibly long term video EEG monitoring.    PHYSICAL EXAM  Temp:  [97.7 F (36.5 C)-98.2 F (36.8 C)] 98.2 F (36.8 C) (01/17 0825) Pulse Rate:  [63-71] 63 (01/17 0525) Resp:  [18-23] 18 (01/17 0825) BP: (119-146)/(67-79) 146/69 (01/17 0825) SpO2:  [94 %-96 %] 95 % (01/17 0825) Weight:  [166 lb 12.8 oz (75.7 kg)] 166 lb 12.8 oz (75.7 kg) (01/17 0415)  General - Well nourished, well developed, sleeping.  Ophthalmologic - fundi not visualized due to  noncooperation.  Cardiovascular - Regular rate and rhythm.  Neuro - sleeping due to ativan for MRI, not easily woke up, with constant stimulation, will open eyes briefly and then close eyes. Not able to maintain eye opening. Did not answer orientation questions. Eyes middle position, not blinking to visual thereat with eyelid held open. Positive corneal and gag. On pain stimulation, move all extremities slightly, symmetric. DTR 1+ and no babinski. Sensation, coordination and gait not tested.   ASSESSMENT/PLAN 67 y.o. female with PMH of HTN, DM, HLD and complicated migarineadmitted for confusion, HA and word finding difficulty. She was admitted twice in the past for HA and expressive aphasia, considered complicated migraine. On depakoete at home. EEG left temporal slowing but no seizure. She was found to have low sodium level Na 123. However, her Na was normal last admission in 10/2017.   Seizure likely - etiology not clear. Had several episodes of confusion with aphasia since 01/2016 with abnormal EEG. Current event concerning for seizure on EEG. Taken together, could be due to dementia. Less concerning for CNS infection  Resultant intermittent confusion  MRI with and without contrast - negative  EEG - concerning for NCSE  LTM EEG pending  lovenox for VTE  prophylaxis  Diet Heart Room service appropriate? Yes; Fluid consistency: Thin; Fluid restriction: 1200 mL Fluid   aspirin 81 mg daily prior to admission, now on aspirin 81 mg daily.   Loaded with depakote last night  On depakote bid dosing  depakote level daily in am  Therapy recommendations:  pending  Disposition:  pending  Hyponatremia   Na 123->129->131->132  Etiology unclear  Initially concerning for depakote related, but < 1% according to uptodate  Currently on depakote loading and maintenance dose  Na improving.   Cognitive impairment   Since 01/2017, intermittent episode of confusion and aphasia  Pt mom  died of AD  Pt showed some sign of cognitive impairment since this summer but mild and intermittent  Taken all pictures together, concerning for underlying cognitive impairment.   DM   Stable CBG  A1C 5.8 this admission  SSI   CBG monitoring  Hypertension Stable  Long term BP goal normotensive  Hyperlipidemia  Home meds:  None, intolerance of statin   LDL 101 last month, goal < 70  Will get repatha as outpt  Other Stroke Risk Factors  Advanced age  Hx stroke/TIA - MRI old left CR infarct  Other Active Problems  AKI with Elevated Cre - improving  Hospital day # 2  I spent  35 minutes in total face-to-face time with the patient, more than 50% of which was spent in counseling and coordination of care, reviewing test results, images and medication, and discussing the diagnosis of seizure, NCSE, treatment plan and potential prognosis. This patient's care requiresreview of multiple databases, neurological assessment, discussion with family, other specialists and medical decision making of high complexity.   Rosalin Hawking, MD PhD Stroke Neurology 11/30/2017 12:59 PM    To contact Stroke Continuity provider, please refer to [email or web address] After hours, contact General Neurology

## 2017-11-30 NOTE — Progress Notes (Signed)
vLTM EEG running. No skin breakdown. Tested event button/ notified neuro

## 2017-11-30 NOTE — Progress Notes (Signed)
Neurologist Xu paged to see If he wanted RN to give am dose of depakote PO when pt awakes. Will continue to monitor pt & await any changes in orders.Hoover Brunette, RN

## 2017-11-30 NOTE — Progress Notes (Signed)
RN held am dose of pt's depakote d/t pt having an episode of AMS & not following commands. Per MD Regalado hold unit Neurologist Xu saw pt. RN spoke with Erlinda Hong, MD who stated to resume pt's depakote bid once pt awake. Hoover Brunette, RN

## 2017-12-01 DIAGNOSIS — R410 Disorientation, unspecified: Secondary | ICD-10-CM

## 2017-12-01 LAB — BASIC METABOLIC PANEL
Anion gap: 10 (ref 5–15)
BUN: 15 mg/dL (ref 6–20)
CHLORIDE: 95 mmol/L — AB (ref 101–111)
CO2: 23 mmol/L (ref 22–32)
Calcium: 9.3 mg/dL (ref 8.9–10.3)
Creatinine, Ser: 0.97 mg/dL (ref 0.44–1.00)
GFR calc non Af Amer: 59 mL/min — ABNORMAL LOW (ref >60)
GLUCOSE: 104 mg/dL — AB (ref 65–99)
Potassium: 4.5 mmol/L (ref 3.5–5.1)
Sodium: 128 mmol/L — ABNORMAL LOW (ref 135–145)

## 2017-12-01 LAB — CBC
HEMATOCRIT: 37.9 % (ref 36.0–46.0)
HEMOGLOBIN: 12.8 g/dL (ref 12.0–15.0)
MCH: 30 pg (ref 26.0–34.0)
MCHC: 33.8 g/dL (ref 30.0–36.0)
MCV: 88.8 fL (ref 78.0–100.0)
Platelets: 352 10*3/uL (ref 150–400)
RBC: 4.27 MIL/uL (ref 3.87–5.11)
RDW: 13.2 % (ref 11.5–15.5)
WBC: 8.5 10*3/uL (ref 4.0–10.5)

## 2017-12-01 LAB — CSF CELL COUNT WITH DIFFERENTIAL
RBC COUNT CSF: 94 /mm3 — AB
RBC Count, CSF: 0 /mm3
TUBE #: 4
Tube #: 1
WBC CSF: 0 /mm3 (ref 0–5)
WBC, CSF: 0 /mm3 (ref 0–5)

## 2017-12-01 LAB — PROTEIN AND GLUCOSE, CSF
GLUCOSE CSF: 59 mg/dL (ref 40–70)
Total  Protein, CSF: 17 mg/dL (ref 15–45)

## 2017-12-01 LAB — RENAL FUNCTION PANEL
ALBUMIN: 3.1 g/dL — AB (ref 3.5–5.0)
ANION GAP: 10 (ref 5–15)
BUN: 10 mg/dL (ref 6–20)
CHLORIDE: 93 mmol/L — AB (ref 101–111)
CO2: 25 mmol/L (ref 22–32)
Calcium: 9 mg/dL (ref 8.9–10.3)
Creatinine, Ser: 1.04 mg/dL — ABNORMAL HIGH (ref 0.44–1.00)
GFR calc Af Amer: >60 mL/min (ref >60)
GFR calc non Af Amer: 54 mL/min — ABNORMAL LOW (ref >60)
GLUCOSE: 93 mg/dL (ref 65–99)
PHOSPHORUS: 3.7 mg/dL (ref 2.5–4.6)
POTASSIUM: 4.6 mmol/L (ref 3.5–5.1)
Sodium: 128 mmol/L — ABNORMAL LOW (ref 135–145)

## 2017-12-01 LAB — CRYPTOCOCCAL ANTIGEN, CSF: Crypto Ag: NEGATIVE

## 2017-12-01 LAB — VALPROIC ACID LEVEL: Valproic Acid Lvl: 46 ug/mL — ABNORMAL LOW (ref 50.0–100.0)

## 2017-12-01 LAB — RPR: RPR: NONREACTIVE

## 2017-12-01 MED ORDER — LIDOCAINE HCL 1 % IJ SOLN
10.0000 mL | Freq: Once | INTRAMUSCULAR | Status: AC
  Administered 2017-12-01: 10 mL via INTRADERMAL
  Filled 2017-12-01: qty 10

## 2017-12-01 MED ORDER — LEVETIRACETAM 500 MG PO TABS
500.0000 mg | ORAL_TABLET | Freq: Two times a day (BID) | ORAL | Status: DC
  Administered 2017-12-01 – 2017-12-02 (×3): 500 mg via ORAL
  Filled 2017-12-01 (×3): qty 1

## 2017-12-01 NOTE — Progress Notes (Signed)
STROKE TEAM PROGRESS NOTE   SUBJECTIVE (INTERVAL HISTORY) Her daughter is at the bedside. EEG LTM overnight generalized slowing and no seizure. However, her Na down to 128, depakote stopped and changed to keppra. Pt still has deficit on backward spelling, calculation, and two step commands. Will do LP to rule out CNS infection.    OBJECTIVE Temp:  [97.7 F (36.5 C)-98.4 F (36.9 C)] 98.4 F (36.9 C) (01/18 0436) Pulse Rate:  [71-76] 71 (01/18 0436) Cardiac Rhythm: Normal sinus rhythm (01/17 2100) Resp:  [18] 18 (01/18 0436) BP: (119-152)/(68-86) 152/86 (01/18 0436) SpO2:  [95 %-99 %] 99 % (01/18 0436) Weight:  [165 lb 3.2 oz (74.9 kg)] 165 lb 3.2 oz (74.9 kg) (01/18 0500)  Recent Labs  Lab 11/28/17 1800  GLUCAP 103*   Recent Labs  Lab 11/29/17 0916 11/29/17 1420 11/29/17 2029 11/30/17 0529 12/01/17 0325  NA 131* 129* 130* 132* 128*  K 4.6 4.5 4.4 4.6 4.6  CL 96* 94* 95* 97* 93*  CO2 24 24 23 23 25   GLUCOSE 152* 194* 161* 96 93  BUN 16 16 15 13 10   CREATININE 1.23* 1.21* 1.14* 1.14* 1.04*  CALCIUM 8.4* 8.3* 8.4* 8.7* 9.0  PHOS  --   --   --   --  3.7   Recent Labs  Lab 11/28/17 1801 12/01/17 0325  AST 32  --   ALT 25  --   ALKPHOS 62  --   BILITOT 0.5  --   PROT 6.3*  --   ALBUMIN 3.4* 3.1*   Recent Labs  Lab 11/28/17 1801 11/29/17 0916 12/01/17 0325  WBC 12.0* 7.6 8.5  NEUTROABS  --  5.1  --   HGB 13.1 12.0 12.8  HCT 37.8 35.3* 37.9  MCV 87.7 87.8 88.8  PLT 349 314 352   No results for input(s): CKTOTAL, CKMB, CKMBINDEX, TROPONINI in the last 168 hours. No results for input(s): LABPROT, INR in the last 72 hours. Recent Labs    11/29/17 0157  COLORURINE STRAW*  LABSPEC 1.008  PHURINE 7.0  GLUCOSEU NEGATIVE  HGBUR NEGATIVE  BILIRUBINUR NEGATIVE  KETONESUR NEGATIVE  PROTEINUR NEGATIVE  NITRITE NEGATIVE  LEUKOCYTESUR SMALL*       Component Value Date/Time   CHOL 186 10/17/2017 0650   TRIG 75 10/17/2017 0650   HDL 70 10/17/2017 0650    CHOLHDL 2.7 10/17/2017 0650   VLDL 15 10/17/2017 0650   LDLCALC 101 (H) 10/17/2017 0650   Lab Results  Component Value Date   HGBA1C 5.8 (H) 11/29/2017   No results found for: LABOPIA, COCAINSCRNUR, LABBENZ, AMPHETMU, THCU, LABBARB  No results for input(s): ETH in the last 168 hours.  I have personally reviewed the radiological images below and agree with the radiology interpretations.  Ct Head Wo Contrast  Result Date: 11/28/2017 CLINICAL DATA:  Migraine last week. Recent UTI. Altered mental status. EXAM: CT HEAD WITHOUT CONTRAST TECHNIQUE: Contiguous axial images were obtained from the base of the skull through the vertex without intravenous contrast. COMPARISON:  10/17/2017 FINDINGS: Brain: Ventricles, cisterns and other CSF spaces are within normal. There is chronic ischemic microvascular disease. Old lacune infarct over the left lentiform nucleus/internal capsule. No evidence of mass, mass effect, shift of midline structures or acute hemorrhage. No evidence of acute infarction. Vascular: No hyperdense vessel or unexpected calcification. Skull: Normal. Negative for fracture or focal lesion. Sinuses/Orbits: No acute finding. Other: None. IMPRESSION: No acute findings. Chronic ischemic microvascular disease. Old left basal ganglia lacunar infarct. Electronically Signed  By: Marin Olp M.D.   On: 11/28/2017 19:44   Mr Jeri Cos DZ Contrast  Result Date: 11/30/2017 CLINICAL DATA:  New onset first time seizure. EXAM: MRI HEAD WITHOUT AND WITH CONTRAST TECHNIQUE: Multiplanar, multiecho pulse sequences of the brain and surrounding structures were obtained without and with intravenous contrast. CONTRAST:  69mL MULTIHANCE GADOBENATE DIMEGLUMINE 529 MG/ML IV SOLN COMPARISON:  Head CT 11/28/2017.  MRI 10/16/2017 and 01/23/2017. FINDINGS: Brain: Diffusion imaging does not show any acute or subacute infarction. There are extensive chronic small vessel ischemic changes throughout the pons. No focal  cerebellar insult. There are old small vessel infarctions affecting the thalami, basal ganglia and the cerebral hemispheric white matter. More extensive old infarctions in the left basal ganglia compared to the right. No large vessel territory infarction. No mass lesion, hemorrhage, hydrocephalus or extra-axial collection. Vascular: Major vessels at the base of the brain show flow. Skull and upper cervical spine: Negative Sinuses/Orbits: Clear/normal Other: None IMPRESSION: No acute finding by MRI. No specific cause of seizure identified. Extensive chronic small-vessel ischemic changes throughout the brain as outlined above, similar to the study of last year. Electronically Signed   By: Nelson Chimes M.D.   On: 11/30/2017 11:27   EEG 10/29/18-  This is an abnormal EEG.  Multiple episodes of paroxysmal generalized fast activity, with susbsequent generalized rhythmic slowing, were seen throughout the tracing which could imply the possibility of non-convulsive, electrographic seizures.  At times, the faster activity seemed to have a right hemispheric predominance.  There were no definite clinical manifestations for the episodes, however clinical correlation is advised in regards to ongoing use of anticonvulsant agents, or possibly long term video EEG monitoring.  LTM EEG 10/30/18 This is an abnormal EEG due to the presence of intermittent generalized theta slowing during wakefulness, suggesting mild encephalopathy, which is nonspecific as to etiology.  No epileptiform discharge or seizure was in evidence.     PHYSICAL EXAM  Temp:  [97.7 F (36.5 C)-98.4 F (36.9 C)] 98.4 F (36.9 C) (01/18 0436) Pulse Rate:  [71-76] 71 (01/18 0436) Resp:  [18] 18 (01/18 0436) BP: (119-152)/(68-86) 152/86 (01/18 0436) SpO2:  [95 %-99 %] 99 % (01/18 0436) Weight:  [165 lb 3.2 oz (74.9 kg)] 165 lb 3.2 oz (74.9 kg) (01/18 0500)  General - Well nourished, well developed, not in distress.  Ophthalmologic - fundi not  visualized due to noncooperation.  Cardiovascular - Regular rate and rhythm.  Mental Status -  Level of arousal and orientation to place, month and person were intact, not orientated to year. Language including expression, naming, repetition, comprehension was assessed and found intact. Attention span and concentration were impaired, not able to backward spelling or calculate with subtraction, ok with addition. 3/3 registration but 0/3 delayed recall.   Cranial Nerves II - XII - II - Vision intact OU. III, IV, VI - Extraocular movements intact. V - Facial sensation intact bilaterally. VII - Facial movement intact bilaterally. VIII - Hearing & vestibular intact bilaterally. X - Palate elevates symmetrically. XI - Chin turning & shoulder shrug intact bilaterally. XII - Tongue protrusion intact.  Motor Strength - The patient's strength was normal in all extremities and pronator drift was absent.   Motor Tone & Bulk - Muscle tone was assessed at the neck and appendages and was normal.  Bulk was normal and fasciculations were absent.   Reflexes - The patient's reflexes were normal in all extremities and she had no pathological reflexes.  Sensory - Light  touch, temperature/pinprick were assessed and were normal.    Coordination - The patient had normal movements in the hands with no ataxia or dysmetria.  Tremor was absent.  Gait and Station - deferred   ASSESSMENT/PLAN 67 y.o. female with PMH of HTN, DM, HLD and complicated migarineadmitted for confusion, HA and word finding difficulty. She was admitted twice in the past for HA and expressive aphasia, considered complicated migraine. On depakoete at home. EEG left temporal slowing but no seizure. She was found to have low sodium level Na 123. However, her Na was normal last admission in 10/2017.   Seizure likely - etiology not clear. Had several episodes of confusion with aphasia since 01/2016 with abnormal EEG. Current event  concerning for seizure on EEG. Taken together, could be due to dementia. Less concerning for CNS infection  Resultant intermittent confusion  MRI with and without contrast - negative  EEG - concerning for NCSE  LTM EEG generalized slowing and no seizure  CSF pending  lovenox for VTE prophylaxis  Diet Heart Room service appropriate? Yes; Fluid consistency: Thin; Fluid restriction: 1200 mL Fluid   aspirin 81 mg daily prior to admission, now on aspirin 81 mg daily.   depakote changed to keppra due to hyponatremia  depakote level 46 low  Therapy recommendations:  pending  Disposition:  pending  Hyponatremia   Na 123->129->131->132->128  Etiology unclear  Initially concerning for depakote related, but < 1% according to uptodate  Currently on depakote loading and maintenance dose  depakote now changed to keppra  Cognitive impairment   Since 01/2017, intermittent episode of confusion and aphasia  Pt mom died of AD  Pt showed sign of cognitive impairment since this summer but mild and intermittent  This admission, not able to backward spell, calculation, delayed recall 0/3, not able to follow two step commands  Taken all pictures together, concerning for underlying cognitive impairment.   Need outpt neuropsych testing  DM   Stable CBG  A1C 5.8 this admission  SSI   CBG monitoring  Hypertension Stable  Long term BP goal normotensive  Hyperlipidemia  Home meds:  None, intolerance of statin   LDL 101 last month, goal < 70  Will get repatha as outpt  Other Stroke Risk Factors  Advanced age  Hx stroke/TIA - MRI old left CR infarct  Other Active Problems  AKI with Elevated Cre - improving  Hospital day # 3  Rosalin Hawking, MD PhD Stroke Neurology 12/01/2017 3:02 PM   To contact Stroke Continuity provider, please refer to [email or web address] After hours, contact General Neurology

## 2017-12-01 NOTE — Procedures (Signed)
Continuous Video-EEG Monitoring Report     Patient:                       Bonnie Cook, Bonnie Cook  MRN:                           [id number] DOB:                           25-Dec-1950   Study Duration:         11/30/2017 12:57 to 12/01/2017 07:30 CPT Code:                  94585 Diagnosis:                  Altered mental status (R41.82)   History: This is a 67 year old female presenting with altered mental status.  Continuous video-EEG monitoring was performed to evaluate for seizures.     Technical Details:  Long-term video-EEG monitoring was performed using standard setting per the guidelines.  Briefly, a minimum of 21 electrodes were placed on scalp according to the International 10-20 or/and 10-10 Systems.  Supplemental electrodes were placed as needed.  Single EKG electrode was also used to detect cardiac arrhythmia.  Patient's behavior was continuously recorded on video simultaneously with EEG.  A minimum of 16 channels were used for data display.  Each epoch of study was reviewed manually daily and as needed using standard referential and bipolar montages.     EEG Description:  Diffuse beta activity was present, which was most likely due to psychotropic medication effect.  During wakefulness, there was bilateral 11 Hz posterior dominant rhythm.  Intermittent generalized theta slowing (4-5 Hz) was present as well during wakefulness.  During drowsiness, slow roving eye movement, bilateral slowing (most prominent in temporal regions) and vertex waves were present.  During stage II sleep, spindles were present.  No epileptiform discharge or seizure was present.     Impression:  This is an abnormal EEG due to the presence of intermittent generalized theta slowing during wakefulness, suggesting mild encephalopathy, which is nonspecific as to etiology.  No epileptiform discharge or seizure was in evidence.         [place] Physician: Winfield Cunas, MD, PhD

## 2017-12-01 NOTE — Care Management Important Message (Signed)
Important Message  Patient Details  Name: Bonnie Cook MRN: [id number] Date of Birth: 06-Sep-1951   Medicare Important Message Given:  Yes    [name] 12/01/2017, 2:29 PM

## 2017-12-01 NOTE — Progress Notes (Signed)
PROGRESS NOTE    Bonnie Cook  STM:[id number] DOB: 06-10-1951 DOA: 11/28/2017 PCP: Myrlene Broker, MD    Brief Narrative: Bonnie Cook is a 67 y.o. female with medical history significant for chronic migraines, type 2 diabetes mellitus, and hypertension, now presenting to the emergency department for evaluation of confusion.  Patient reportedly developed confusion with her migraine headaches frequently.  She has been experiencing worsening headaches recently and was started on Depakote by her neurologist.  She had a severe migraine that was treated a couple days ago at urgent care with Toradol and Benadryl injections.  Headache has since resolved, but her confusion has continued to worsen.  She was recently diagnosed with UTI and has been treated with antibiotics.  She denies any fevers or flank pain.  Denies fall or trauma.    ED Course: Upon arrival to the ED, patient is found to be afebrile, saturating well on room air, and was otherwise normal.  Noncontrast head CT is negative for acute intracranial abnormality.  Chemistry panel features a sodium of 123, normal 6 weeks ago.  CBC features a leukocytosis to 12,000.  Patient was treated with a liter of normal saline in the ED she remains hemodynamically stable, in no apparent respiratory distress, but is slightly confused, likely secondary to hyponatremia, and will be admitted to the hospital for ongoing evaluation and management of this    Assessment & Plan:   Principal Problem:   Hyponatremia Active Problems:   Essential hypertension   Diabetes mellitus without complication ([place])   History of stroke   Acute metabolic encephalopathy   Chronic migraine   UTI (urinary tract infection)   Seizures (HCC)   Cognitive impairment   1-Hyponatremia; SIADH -Urine osmolality 353, urine sodium 114, consistent with SIADH.  -Continue with water restriction.  -hold lisinopril.  -confusion improving. Sodium level trending down .  -discussed  with neurology, regarding changing Depakote  Repeat sodium this afternoon.   2-Acute encephalopathy; now considering seizure. EEG abnormal with multiples non convulsive spikes.  Episode of worsening confusion this am. Over time she improved.  MRI brain negative , no acute finding.  Appreciate Dr Erlinda Hong evaluation.  Patient was loaded with depakote last night.  TSH normal , B 12 elevated, thiamine, RPR negative >  24 hour EEG,  Plan to change depakote to keppra due to hyponatremia.   3-Recent UTI. She has received 5 days of cipr. Repeated urine culture ; insignificant growth.    DVT prophylaxis:Lovenox Code Status: Full code.  Family Communication: care discussed with patient 's daughter  Disposition Plan: confusion improving, but hyponatremia is worse,    Consultants:   Neurology    Procedures:   EEG;   Antimicrobials:    Subjective: She is better today, per daughter patient confusion has improved.  Oriented to person , place, able to recognized daughter   Objective: Vitals:   11/30/17 0825 11/30/17 2117 12/01/17 0436 12/01/17 0500  BP: (!) 146/69 119/68 (!) 152/86   Pulse:  76 71   Resp: 18 18 18    Temp: 98.2 F (36.8 C) 97.7 F (36.5 C) 98.4 F (36.9 C)   TempSrc: Oral Oral Oral   SpO2: 95% 98% 99%   Weight:    74.9 kg (165 lb 3.2 oz)  Height:        Intake/Output Summary (Last 24 hours) at 12/01/2017 1359 Last data filed at 12/01/2017 0900 Gross per 24 hour  Intake 560 ml  Output -  Net 560 ml  Filed Weights   11/29/17 0640 11/30/17 0415 12/01/17 0500  Weight: 76.3 kg (168 lb 3.4 oz) 75.7 kg (166 lb 12.8 oz) 74.9 kg (165 lb 3.2 oz)    Examination:  General exam: NAD Respiratory system: CTA Cardiovascular system: S 1, S 2 RRR Gastrointestinal system: BS present, soft, nt Central nervous system: alert, non focal.  Extremities: Symmetric 5 x 5 power. Skin: No rashes, lesions or ulcers     Data Reviewed: I have personally reviewed following  labs and imaging studies  CBC: Recent Labs  Lab 11/28/17 1801 11/29/17 0916 12/01/17 0325  WBC 12.0* 7.6 8.5  NEUTROABS  --  5.1  --   HGB 13.1 12.0 12.8  HCT 37.8 35.3* 37.9  MCV 87.7 87.8 88.8  PLT 349 314 332   Basic Metabolic Panel: Recent Labs  Lab 11/29/17 1420 11/29/17 2029 11/30/17 0529 12/01/17 0325 12/01/17 1207  NA 129* 130* 132* 128* 128*  K 4.5 4.4 4.6 4.6 4.5  CL 94* 95* 97* 93* 95*  CO2 24 23 23 25 23   GLUCOSE 194* 161* 96 93 104*  BUN 16 15 13 10 15   CREATININE 1.21* 1.14* 1.14* 1.04* 0.97  CALCIUM 8.3* 8.4* 8.7* 9.0 9.3  PHOS  --   --   --  3.7  --    GFR: Estimated Creatinine Clearance: 53.8 mL/min (by C-G formula based on SCr of 0.97 mg/dL). Liver Function Tests: Recent Labs  Lab 11/28/17 1801 12/01/17 0325  AST 32  --   ALT 25  --   ALKPHOS 62  --   BILITOT 0.5  --   PROT 6.3*  --   ALBUMIN 3.4* 3.1*   No results for input(s): LIPASE, AMYLASE in the last 168 hours. Recent Labs  Lab 11/29/17 1051  AMMONIA 29   Coagulation Profile: No results for input(s): INR, PROTIME in the last 168 hours. Cardiac Enzymes: No results for input(s): CKTOTAL, CKMB, CKMBINDEX, TROPONINI in the last 168 hours. BNP (last 3 results) No results for input(s): PROBNP in the last 8760 hours. HbA1C: Recent Labs    11/29/17 1051  HGBA1C 5.8*   CBG: Recent Labs  Lab 11/28/17 1800  GLUCAP 103*   Lipid Profile: No results for input(s): CHOL, HDL, LDLCALC, TRIG, CHOLHDL, LDLDIRECT in the last 72 hours. Thyroid Function Tests: Recent Labs    11/30/17 1404  TSH 2.636   Anemia Panel: Recent Labs    11/30/17 1404  VITAMINB12 1,182*   Sepsis Labs: No results for input(s): PROCALCITON, LATICACIDVEN in the last 168 hours.  Recent Results (from the past 240 hour(s))  Urine culture     Status: Abnormal   Collection Time: 11/29/17  1:57 AM  Result Value Ref Range Status   Specimen Description URINE, CLEAN CATCH  Final   Special Requests NONE  Final    Culture <10,000 COLONIES/mL INSIGNIFICANT GROWTH (A)  Final   Report Status 11/30/2017 FINAL  Final         Radiology Studies: Mr Jeri Cos RJ Contrast  Result Date: 11/30/2017 CLINICAL DATA:  New onset first time seizure. EXAM: MRI HEAD WITHOUT AND WITH CONTRAST TECHNIQUE: Multiplanar, multiecho pulse sequences of the brain and surrounding structures were obtained without and with intravenous contrast. CONTRAST:  42mL MULTIHANCE GADOBENATE DIMEGLUMINE 529 MG/ML IV SOLN COMPARISON:  Head CT 11/28/2017.  MRI 10/16/2017 and 01/23/2017. FINDINGS: Brain: Diffusion imaging does not show any acute or subacute infarction. There are extensive chronic small vessel ischemic changes throughout the pons. No focal  cerebellar insult. There are old small vessel infarctions affecting the thalami, basal ganglia and the cerebral hemispheric white matter. More extensive old infarctions in the left basal ganglia compared to the right. No large vessel territory infarction. No mass lesion, hemorrhage, hydrocephalus or extra-axial collection. Vascular: Major vessels at the base of the brain show flow. Skull and upper cervical spine: Negative Sinuses/Orbits: Clear/normal Other: None IMPRESSION: No acute finding by MRI. No specific cause of seizure identified. Extensive chronic small-vessel ischemic changes throughout the brain as outlined above, similar to the study of last year. Electronically Signed   By: Nelson Chimes M.D.   On: 11/30/2017 11:27        Scheduled Meds: . aspirin EC  81 mg Oral Daily  . citalopram  20 mg Oral Daily  . colestipol  1 g Oral BID  . levETIRAcetam  500 mg Oral BID  . lidocaine  10 mL Intradermal Once  . loratadine  10 mg Oral Daily  . multivitamin with minerals  1 tablet Oral Daily  . sodium chloride flush  3 mL Intravenous Q12H  . sodium chloride flush  3 mL Intravenous Q12H   Continuous Infusions: . sodium chloride       LOS: 3 days    Time spent: 35 minutes.      Elmarie Shiley, MD [facility] Pager [phone]  If 7PM-7AM, please contact night-coverage [email or web address] Password [facility] 12/01/2017, 1:59 PM

## 2017-12-01 NOTE — Procedures (Signed)
Procedure Note: Lumbar puncture  Indications: assess for CNS pathology   Operator: Rosalin Hawking, MD, PhD  Others present: RN  Indications, risks, and benefits explained to patient / surrogate decision maker and informed consent obtained. Time-out was performed, with all individuals present agreeing on the procedure to be performed, the site of procedure, and the patient identity.  Patient positioned, prepped and draped in usual sterile fashion. L3-4 space located using bilateral iliac crests as landmarks. 1% Lidocaine without epinephrine was used to anesthetize the area. An 20G spinal needle was introduced into the sub- arachnoid space. Stylet was removed with appropriate fluid return. Needle removed after adequate fluid collected. Blood loss was minimal. A dry guaze dressing was placed over insertion site. Patient tolerated the procedure well and no complications were observed. Spontaneous movement of bilateral extremities were observed after the procedure.  Opening pressure: not measured, pt back pain with needle movement  Total fluid removed: 10cc  Color of fluid: clear  Sent for: cell count + diff , gram stain, cultures, glucose, protein, crypto antigen, HSV PCR, VDRL, enterococco PCR  Rosalin Hawking, MD PhD Stroke Neurology 12/01/2017 2:50 PM

## 2017-12-01 NOTE — Progress Notes (Signed)
LTM EEG discontinued. No skin breakdown noted. 

## 2017-12-02 DIAGNOSIS — E871 Hypo-osmolality and hyponatremia: Secondary | ICD-10-CM

## 2017-12-02 LAB — RENAL FUNCTION PANEL
ALBUMIN: 3.3 g/dL — AB (ref 3.5–5.0)
ANION GAP: 11 (ref 5–15)
BUN: 17 mg/dL (ref 6–20)
CALCIUM: 9.3 mg/dL (ref 8.9–10.3)
CO2: 27 mmol/L (ref 22–32)
CREATININE: 1.16 mg/dL — AB (ref 0.44–1.00)
Chloride: 93 mmol/L — ABNORMAL LOW (ref 101–111)
GFR calc Af Amer: 55 mL/min — ABNORMAL LOW (ref >60)
GFR, EST NON AFRICAN AMERICAN: 47 mL/min — AB (ref >60)
Glucose, Bld: 97 mg/dL (ref 65–99)
PHOSPHORUS: 3.5 mg/dL (ref 2.5–4.6)
Potassium: 4.4 mmol/L (ref 3.5–5.1)
SODIUM: 131 mmol/L — AB (ref 135–145)

## 2017-12-02 LAB — CBC
HCT: 40 % (ref 36.0–46.0)
HEMOGLOBIN: 13.8 g/dL (ref 12.0–15.0)
MCH: 30.5 pg (ref 26.0–34.0)
MCHC: 34.5 g/dL (ref 30.0–36.0)
MCV: 88.3 fL (ref 78.0–100.0)
PLATELETS: 366 10*3/uL (ref 150–400)
RBC: 4.53 MIL/uL (ref 3.87–5.11)
RDW: 13.3 % (ref 11.5–15.5)
WBC: 9.3 10*3/uL (ref 4.0–10.5)

## 2017-12-02 LAB — VITAMIN B1: VITAMIN B1 (THIAMINE): 124.7 nmol/L (ref 66.5–200.0)

## 2017-12-02 MED ORDER — LEVETIRACETAM 500 MG PO TABS: 500.0000 mg | ORAL_TABLET | Freq: Two times a day (BID) | ORAL | 0 refills | Status: DC

## 2017-12-02 NOTE — Care Management Note (Signed)
Case Management Note  Patient Details  Name: Bonnie Cook MRN: [id number] Date of Birth: 11-27-1950  Subjective/Objective:     Pt presented for confusion and HA.  Pt from home alone but will return home with daughter.  Pt will stay with friend while daughter works during the day.                Action/Plan: Presented choice for [facility] PT services.  Pt and daughter choose [facility] to provide [facility] PT.  Jermaine with [facility] contacted and [facility] does not provide therapy in that zip code.  Sharmon Revere with [facility] accepted referral.  Pt and family advised.    Expected Discharge Date:  12/02/17               Expected Discharge Plan:  [place]  In-House Referral:  NA  Discharge planning Services  CM Consult  Post Acute Care Choice:  Home Health Choice offered to:  Patient, Adult Children  DME Arranged:  N/A DME Agency:  N/A HH Arranged:  PT HH Agency: [facility] Status of Service:  Completed, signed off  If discussed at [place] of Stay Meetings, dates discussed:    Additional Comments:  Arley Phenix, RN 12/02/2017, 3:09 PM

## 2017-12-02 NOTE — Progress Notes (Addendum)
STROKE TEAM PROGRESS NOTE   SUBJECTIVE (INTERVAL HISTORY) Her daughter iand other family members are at the bedside. CSF studies are all normal so far. her Na now up to 131 today   depakote stopped and changed to keppra. Patient wants to go home    OBJECTIVE Temp:  [98 F (36.7 C)-98.3 F (36.8 C)] 98.1 F (36.7 C) (01/19 0415) Pulse Rate:  [76-86] 76 (01/19 0415) Cardiac Rhythm: Normal sinus rhythm (01/19 0930) Resp:  [16-18] 18 (01/19 0415) BP: (118-151)/(69-84) 151/84 (01/19 0415) SpO2:  [95 %-96 %] 96 % (01/19 0415) Weight:  [163 lb 3.2 oz (74 kg)] 163 lb 3.2 oz (74 kg) (01/19 0500)  Recent Labs  Lab 11/28/17 1800  GLUCAP 103*   Recent Labs  Lab 11/29/17 2029 11/30/17 0529 12/01/17 0325 12/01/17 1207 12/02/17 0313  NA 130* 132* 128* 128* 131*  K 4.4 4.6 4.6 4.5 4.4  CL 95* 97* 93* 95* 93*  CO2 23 23 25 23 27   GLUCOSE 161* 96 93 104* 97  BUN 15 13 10 15 17   CREATININE 1.14* 1.14* 1.04* 0.97 1.16*  CALCIUM 8.4* 8.7* 9.0 9.3 9.3  PHOS  --   --  3.7  --  3.5   Recent Labs  Lab 11/28/17 1801 12/01/17 0325 12/02/17 0313  AST 32  --   --   ALT 25  --   --   ALKPHOS 62  --   --   BILITOT 0.5  --   --   PROT 6.3*  --   --   ALBUMIN 3.4* 3.1* 3.3*   Recent Labs  Lab 11/28/17 1801 11/29/17 0916 12/01/17 0325 12/02/17 0313  WBC 12.0* 7.6 8.5 9.3  NEUTROABS  --  5.1  --   --   HGB 13.1 12.0 12.8 13.8  HCT 37.8 35.3* 37.9 40.0  MCV 87.7 87.8 88.8 88.3  PLT 349 314 352 366   No results for input(s): CKTOTAL, CKMB, CKMBINDEX, TROPONINI in the last 168 hours. No results for input(s): LABPROT, INR in the last 72 hours. No results for input(s): COLORURINE, LABSPEC, [place], GLUCOSEU, HGBUR, BILIRUBINUR, KETONESUR, PROTEINUR, UROBILINOGEN, NITRITE, LEUKOCYTESUR in the last 72 hours.  Invalid input(s): APPERANCEUR     Component Value Date/Time   CHOL 186 10/17/2017 0650   TRIG 75 10/17/2017 0650   HDL 70 10/17/2017 0650   CHOLHDL 2.7 10/17/2017 0650   VLDL 15  10/17/2017 0650   LDLCALC 101 (H) 10/17/2017 0650   Lab Results  Component Value Date   HGBA1C 5.8 (H) 11/29/2017   No results found for: LABOPIA, COCAINSCRNUR, LABBENZ, AMPHETMU, THCU, LABBARB  No results for input(s): ETH in the last 168 hours.  I have personally reviewed the radiological images below and agree with the radiology interpretations.  Ct Head Wo Contrast  Result Date: 11/28/2017 CLINICAL DATA:  Migraine last week. Recent UTI. Altered mental status. EXAM: CT HEAD WITHOUT CONTRAST TECHNIQUE: Contiguous axial images were obtained from the base of the skull through the vertex without intravenous contrast. COMPARISON:  10/17/2017 FINDINGS: Brain: Ventricles, cisterns and other CSF spaces are within normal. There is chronic ischemic microvascular disease. Old lacune infarct over the left lentiform nucleus/internal capsule. No evidence of mass, mass effect, shift of midline structures or acute hemorrhage. No evidence of acute infarction. Vascular: No hyperdense vessel or unexpected calcification. Skull: Normal. Negative for fracture or focal lesion. Sinuses/Orbits: No acute finding. Other: None. IMPRESSION: No acute findings. Chronic ischemic microvascular disease. Old left basal ganglia lacunar infarct.  Electronically Signed   By: Marin Olp M.D.   On: 11/28/2017 19:44   Mr Jeri Cos GY Contrast  Result Date: 11/30/2017 CLINICAL DATA:  New onset first time seizure. EXAM: MRI HEAD WITHOUT AND WITH CONTRAST TECHNIQUE: Multiplanar, multiecho pulse sequences of the brain and surrounding structures were obtained without and with intravenous contrast. CONTRAST:  54mL MULTIHANCE GADOBENATE DIMEGLUMINE 529 MG/ML IV SOLN COMPARISON:  Head CT 11/28/2017.  MRI 10/16/2017 and 01/23/2017. FINDINGS: Brain: Diffusion imaging does not show any acute or subacute infarction. There are extensive chronic small vessel ischemic changes throughout the pons. No focal cerebellar insult. There are old small vessel  infarctions affecting the thalami, basal ganglia and the cerebral hemispheric white matter. More extensive old infarctions in the left basal ganglia compared to the right. No large vessel territory infarction. No mass lesion, hemorrhage, hydrocephalus or extra-axial collection. Vascular: Major vessels at the base of the brain show flow. Skull and upper cervical spine: Negative Sinuses/Orbits: Clear/normal Other: None IMPRESSION: No acute finding by MRI. No specific cause of seizure identified. Extensive chronic small-vessel ischemic changes throughout the brain as outlined above, similar to the study of last year. Electronically Signed   By: Nelson Chimes M.D.   On: 11/30/2017 11:27   EEG 10/29/18-  This is an abnormal EEG.  Multiple episodes of paroxysmal generalized fast activity, with susbsequent generalized rhythmic slowing, were seen throughout the tracing which could imply the possibility of non-convulsive, electrographic seizures.  At times, the faster activity seemed to have a right hemispheric predominance.  There were no definite clinical manifestations for the episodes, however clinical correlation is advised in regards to ongoing use of anticonvulsant agents, or possibly long term video EEG monitoring.  LTM EEG 10/30/18 This is an abnormal EEG due to the presence of intermittent generalized theta slowing during wakefulness, suggesting mild encephalopathy, which is nonspecific as to etiology.  No epileptiform discharge or seizure was in evidence.     PHYSICAL EXAM  Temp:  [98 F (36.7 C)-98.3 F (36.8 C)] 98.1 F (36.7 C) (01/19 0415) Pulse Rate:  [76-86] 76 (01/19 0415) Resp:  [16-18] 18 (01/19 0415) BP: (118-151)/(69-84) 151/84 (01/19 0415) SpO2:  [95 %-96 %] 96 % (01/19 0415) Weight:  [163 lb 3.2 oz (74 kg)] 163 lb 3.2 oz (74 kg) (01/19 0500)  General - Well nourished, well developed, not in distress.  Ophthalmologic - fundi not visualized due to noncooperation.  Cardiovascular  - Regular rate and rhythm.  Mental Status -  Level of arousal and orientation to place, month and person were intact, not orientated to year. Language including expression, naming, repetition, comprehension was assessed and found intact. Attention span and concentration were impaired, 3/3 registration but 0/3 delayed recall.   Cranial Nerves II - XII - II - Vision intact OU. III, IV, VI - Extraocular movements intact. V - Facial sensation intact bilaterally. VII - Facial movement intact bilaterally. VIII - Hearing & vestibular intact bilaterally. X - Palate elevates symmetrically. XI - Chin turning & shoulder shrug intact bilaterally. XII - Tongue protrusion intact.  Motor Strength - The patient's strength was normal in all extremities and pronator drift was absent.   Motor Tone & Bulk - Muscle tone was assessed at the neck and appendages and was normal.  Bulk was normal and fasciculations were absent.   Reflexes - The patient's reflexes were normal in all extremities and she had no pathological reflexes.  Sensory - Light touch, temperature/pinprick were assessed and were normal.  Coordination - The patient had normal movements in the hands with no ataxia or dysmetria.  Tremor was absent.  Gait and Station - deferred   ASSESSMENT/PLAN 67 y.o. female with PMH of HTN, DM, HLD and complicated migarineadmitted for confusion, HA and word finding difficulty. She was admitted twice in the past for HA and expressive aphasia, considered complicated migraine. On depakoete at home. EEG left temporal slowing but no seizure. She was found to have low sodium level Na 123. However, her Na was normal last admission in 10/2017.   Complex partial Seizures likely - etiology not clear. Had several episodes of confusion with aphasia since 01/2016 with abnormal EEG. Current event concerning for seizure on EEG. Taken together, could be due to dementia. Less concerning for CNS  infection  Resultant intermittent confusion now resolving  MRI with and without contrast - negative  EEG - concerning for NCSE  LTM EEG generalized slowing and no seizure  CSF normal protein, glucose and cell count and viral studies pending  lovenox for VTE prophylaxis Diet Heart Room service appropriate? Yes; Fluid consistency: Thin  Diet - low sodium heart healthy   aspirin 81 mg daily prior to admission, now on aspirin 81 mg daily.   depakote changed to keppra due to hyponatremia  depakote level 46 low  Therapy recommendations:  pending  Disposition:  pending  Hyponatremia   Na 123->129->131->132->128  Etiology unclear  Initially concerning for depakote related, but < 1% according to uptodate  Currently on depakote loading and maintenance dose  depakote now changed to keppra  Cognitive impairment   Since 01/2017, intermittent episode of confusion and aphasia  Pt mom died of AD  Pt showed sign of cognitive impairment since this summer but mild and intermittent  This admission, not able to backward spell, calculation, delayed recall 0/3, not able to follow two step commands  Taken all pictures together, concerning for underlying cognitive impairment.   Need outpt neuropsych testing  DM   Stable CBG  A1C 5.8 this admission  SSI   CBG monitoring  Hypertension Stable  Long term BP goal normotensive  Hyperlipidemia  Home meds:  None, intolerance of statin   LDL 101 last month, goal < 70  Will get repatha as outpt  Other Stroke Risk Factors  Advanced age  Hx stroke/TIA - MRI old left CR infarct  Other Active Problems  AKI with Elevated Cre - improving  Hospital day # 4 I had a long discussion the patient and multiple family members. Since a primary spinal fluid results all normal and patient is improving I think is reasonable to discharge her home. She has an upcoming appointment to see Dr. Erlinda Hong for follow-up on 10/05/17 which she was  advised to keep. Continue Keppra at discharge for seizures/migraine prophylaxis. Discuss with Dr. Tyrell Antonio   Greater than 50% time during this 25 minute visit was spent on counseling and coordination of care about her episode of confusion possible complex partial seizure versus complicated migraine and answering questions Antony Contras, MD Stroke Neurology 12/02/2017 1:07 PM   To contact Stroke Continuity provider, please refer to [email or web address] After hours, contact General Neurology

## 2017-12-02 NOTE — Progress Notes (Signed)
Nsg Discharge Note  Admit Date:  11/28/2017 Discharge date: 12/02/2017   Bonnie Cook to be D/C'd home with daughter per MD order.  AVS completed.  Copy for chart, and copy for patient signed, and dated. Patient/caregiver able to verbalize understanding.  Discharge Medication: Allergies as of 12/02/2017      Reactions   Erythromycin Base Other (See Comments)   GI Upset   Penicillins Rash   From childhood: Has patient had a PCN reaction causing immediate rash, facial/tongue/throat swelling, SOB or lightheadedness with hypotension: Yes Has patient had a PCN reaction causing severe rash involving mucus membranes or skin necrosis: Unk Has patient had a PCN reaction that required hospitalization: Unk Has patient had a PCN reaction occurring within the last 10 years: No If all of the above answers are "NO", then may proceed with Cephalosporin use.      Medication List    STOP taking these medications   ciprofloxacin 500 MG tablet Commonly known as:  CIPRO   divalproex 500 MG 24 hr tablet Commonly known as:  DEPAKOTE ER   Evolocumab 140 MG/ML Sosy Commonly known as:  REPATHA   lisinopril 10 MG tablet Commonly known as:  PRINIVIL,ZESTRIL   meloxicam 15 MG tablet Commonly known as:  MOBIC     TAKE these medications   aspirin EC 81 MG tablet Take 81 mg by mouth daily.   citalopram 20 MG tablet Commonly known as:  CELEXA Take 20 mg by mouth daily.   colestipol 1 g tablet Commonly known as:  COLESTID Take 1 g by mouth 2 (two) times daily.   CRANBERRY PO Take 4 tablets by mouth daily.   cyanocobalamin 1000 MCG/ML injection Commonly known as:  (VITAMIN B-12) Inject 1,000 mcg into the muscle every 30 (thirty) days.   levETIRAcetam 500 MG tablet Commonly known as:  KEPPRA Take 1 tablet (500 mg total) by mouth 2 (two) times daily.   loratadine 10 MG tablet Commonly known as:  CLARITIN Take 10 mg by mouth daily.   metFORMIN 850 MG tablet Commonly known as:   GLUCOPHAGE Take 850 mg by mouth 2 (two) times daily.   MULTIVITAMIN ADULT Chew Chew 2 tablets by mouth daily.   ondansetron 4 MG disintegrating tablet Commonly known as:  ZOFRAN-ODT Take 4 mg by mouth 2 (two) times daily as needed for nausea/vomiting.   PROAIR HFA 108 (90 Base) MCG/ACT inhaler Generic drug:  albuterol Inhale 1-2 puffs into the lungs every 6 hours as needed for shortness of breath or wheezing   Vitamin D (Ergocalciferol) 50000 units Caps capsule Commonly known as:  DRISDOL Take 50,000 Units by mouth 2 (two) times a week. On Monday and Thursday       Discharge Assessment: Vitals:   12/01/17 2103 12/02/17 0415  BP: 129/69 (!) 151/84  Pulse: 76 76  Resp: 18 18  Temp: 98 F (36.7 C) 98.1 F (36.7 C)  SpO2: 95% 96%   Skin clean, dry and intact without evidence of skin break down, no evidence of skin tears noted. IV catheter discontinued intact. Site without signs and symptoms of complications - no redness or edema noted at insertion site, patient denies c/o pain - only slight tenderness at site.  Dressing with slight pressure applied.  D/c Instructions-Education: Discharge instructions given to patient/family with verbalized understanding. D/c education completed with patient/family including follow up instructions, medication list, d/c activities limitations if indicated, with other d/c instructions as indicated by MD - patient able to verbalize understanding,  all questions fully answered. Patient instructed to return to ED, call 911, or call MD for any changes in condition.  Patient escorted via [place], and D/C home via private auto.  Salley Slaughter, RN 12/02/2017 4:32 PM

## 2017-12-02 NOTE — Discharge Summary (Signed)
Physician Discharge Summary  Bonnie Cook:[id number] DOB: September 07, 1951 DOA: 11/28/2017  PCP: Myrlene Broker, MD  Admit date: 11/28/2017 Discharge date: 12/02/2017  Admitted From: Home  Disposition: Home   Recommendations for Outpatient Follow-up:  1. Follow up with PCP in 1-2 weeks 2. Please obtain BMP/CBC in one week 3. Please follow up on the following pending results: follow spinal fluid results, viral results.  4. Needs repeat sodium level.  5.   Home Health: yes  Discharge Condition: stable.  CODE STATUS: full code.  Diet recommendation: Heart Healthy  Brief/Interim Summary:  Brief Narrative: Bonnie Cook a 67 y.o.femalewith medical history significant forchronic migraines, type 2 diabetes mellitus, and hypertension, now presenting to the emergency department for evaluation of confusion. Patient reportedly developed confusion with her migraine headaches frequently. She has been experiencing worsening headaches recently andwas started on Depakote by her neurologist. She had a severe migraine that was treated a couple days ago at urgent care with Toradol and Benadryl injections. Headache has since resolved, but her confusion has continued to worsen. She was recently diagnosed with UTI and has been treated with antibiotics. She denies any fevers or flank pain. Denies fall or trauma.   ED Course:Upon arrival to the ED, patient is found to be afebrile, saturating well on room air, and was otherwise normal. Noncontrast head CT is negative for acute intracranial abnormality. Chemistry panel features a sodium of 123, normal 69 weeks ago. CBC features a leukocytosis to 12,000. Patient was treated with a liter of normal saline in the ED she remains hemodynamically stable, in no apparent respiratory distress, but is slightly confused, likely secondary to hyponatremia, and will be admitted to the hospital for ongoing evaluation and management of this    Assessment &  Plan:   Principal Problem:   Hyponatremia Active Problems:   Essential hypertension   Diabetes mellitus without complication ([place])   History of stroke   Acute metabolic encephalopathy   Chronic migraine   UTI (urinary tract infection)   Seizures (HCC)   Cognitive impairment   1-Hyponatremia; SIADH -Urine osmolality 353, urine sodium 114, consistent with SIADH.  -Continue with water restriction.  -hold lisinopril.  -confusion improving. Sodium level trending down .  -discussed with neurology, regarding changing Depakote  Improved with water restriction.   2-Acute encephalopathy; now considering seizure vs complex migraine, vs dementia. EEG abnormal with multiples non convulsive spikes.  Episode of worsening confusion this am. Over time she improved.  MRI brain negative , no acute finding.  Appreciate Dr Erlinda Hong evaluation.  TSH normal , B 12 elevated, thiamine, RPR negative >  24 hour EEG, negative Plan to change depakote to keppra due to hyponatremia.  LP no evidence of infection, herpes PCR pending, Enterovirus pending. Cryptococcus negative, oli clonal pending.   3-Recent UTI. She has received 5 days of cipr. Repeated urine culture ; insignificant growth.      Discharge Diagnoses:  Principal Problem:   Hyponatremia Active Problems:   Essential hypertension   Diabetes mellitus without complication ([place])   History of stroke   Acute metabolic encephalopathy   Chronic migraine   UTI (urinary tract infection)   Seizures (HCC)   Cognitive impairment   Confusion    Discharge Instructions  Discharge Instructions    Diet - low sodium heart healthy   Complete by:  As directed    Increase activity slowly   Complete by:  As directed      Allergies as of 12/02/2017  Reactions   Erythromycin Base Other (See Comments)   GI Upset   Penicillins Rash   From childhood: Has patient had a PCN reaction causing immediate rash, facial/tongue/throat swelling, SOB or  lightheadedness with hypotension: Yes Has patient had a PCN reaction causing severe rash involving mucus membranes or skin necrosis: Unk Has patient had a PCN reaction that required hospitalization: Unk Has patient had a PCN reaction occurring within the last 10 years: No If all of the above answers are "NO", then may proceed with Cephalosporin use.      Medication List    STOP taking these medications   ciprofloxacin 500 MG tablet Commonly known as:  CIPRO   divalproex 500 MG 24 hr tablet Commonly known as:  DEPAKOTE ER   Evolocumab 140 MG/ML Sosy Commonly known as:  REPATHA   lisinopril 10 MG tablet Commonly known as:  PRINIVIL,ZESTRIL   meloxicam 15 MG tablet Commonly known as:  MOBIC     TAKE these medications   aspirin EC 81 MG tablet Take 81 mg by mouth daily.   citalopram 20 MG tablet Commonly known as:  CELEXA Take 20 mg by mouth daily.   colestipol 1 g tablet Commonly known as:  COLESTID Take 1 g by mouth 2 (two) times daily.   CRANBERRY PO Take 4 tablets by mouth daily.   cyanocobalamin 1000 MCG/ML injection Commonly known as:  (VITAMIN B-12) Inject 1,000 mcg into the muscle every 30 (thirty) days.   levETIRAcetam 500 MG tablet Commonly known as:  KEPPRA Take 1 tablet (500 mg total) by mouth 2 (two) times daily.   loratadine 10 MG tablet Commonly known as:  CLARITIN Take 10 mg by mouth daily.   metFORMIN 850 MG tablet Commonly known as:  GLUCOPHAGE Take 850 mg by mouth 2 (two) times daily.   MULTIVITAMIN ADULT Chew Chew 2 tablets by mouth daily.   ondansetron 4 MG disintegrating tablet Commonly known as:  ZOFRAN-ODT Take 4 mg by mouth 2 (two) times daily as needed for nausea/vomiting.   PROAIR HFA 108 (90 Base) MCG/ACT inhaler Generic drug:  albuterol Inhale 1-2 puffs into the lungs every 6 hours as needed for shortness of breath or wheezing   Vitamin D (Ergocalciferol) 50000 units Caps capsule Commonly known as:  DRISDOL Take 50,000  Units by mouth 2 (two) times a week. On Monday and Thursday      Follow-up Information    Myrlene Broker, MD Follow up in 3 day(s).   Specialty:  Family Medicine Contact information: [place] [id number] [phone]        Rosalin Hawking, MD Follow up.   Specialty:  Neurology Why:  keep appointment  Contact information: [address] [place] [address] [phone]          Allergies  Allergen Reactions  . Erythromycin Base Other (See Comments)    GI Upset  . Penicillins Rash    From childhood: Has patient had a PCN reaction causing immediate rash, facial/tongue/throat swelling, SOB or lightheadedness with hypotension: Yes Has patient had a PCN reaction causing severe rash involving mucus membranes or skin necrosis: Unk Has patient had a PCN reaction that required hospitalization: Unk Has patient had a PCN reaction occurring within the last 10 years: No If all of the above answers are "NO", then may proceed with Cephalosporin use.      Consultations:  Neurology    Procedures/Studies: Ct Head Wo Contrast  Result Date: 11/28/2017 CLINICAL  DATA:  Migraine last week. Recent UTI. Altered mental status. EXAM: CT HEAD WITHOUT CONTRAST TECHNIQUE: Contiguous axial images were obtained from the base of the skull through the vertex without intravenous contrast. COMPARISON:  10/17/2017 FINDINGS: Brain: Ventricles, cisterns and other CSF spaces are within normal. There is chronic ischemic microvascular disease. Old lacune infarct over the left lentiform nucleus/internal capsule. No evidence of mass, mass effect, shift of midline structures or acute hemorrhage. No evidence of acute infarction. Vascular: No hyperdense vessel or unexpected calcification. Skull: Normal. Negative for fracture or focal lesion. Sinuses/Orbits: No acute finding. Other: None. IMPRESSION: No acute findings. Chronic ischemic microvascular disease. Old left basal ganglia  lacunar infarct. Electronically Signed   By: Marin Olp M.D.   On: 11/28/2017 19:44   Mr Jeri Cos RU Contrast  Result Date: 11/30/2017 CLINICAL DATA:  New onset first time seizure. EXAM: MRI HEAD WITHOUT AND WITH CONTRAST TECHNIQUE: Multiplanar, multiecho pulse sequences of the brain and surrounding structures were obtained without and with intravenous contrast. CONTRAST:  55mL MULTIHANCE GADOBENATE DIMEGLUMINE 529 MG/ML IV SOLN COMPARISON:  Head CT 11/28/2017.  MRI 10/16/2017 and 01/23/2017. FINDINGS: Brain: Diffusion imaging does not show any acute or subacute infarction. There are extensive chronic small vessel ischemic changes throughout the pons. No focal cerebellar insult. There are old small vessel infarctions affecting the thalami, basal ganglia and the cerebral hemispheric white matter. More extensive old infarctions in the left basal ganglia compared to the right. No large vessel territory infarction. No mass lesion, hemorrhage, hydrocephalus or extra-axial collection. Vascular: Major vessels at the base of the brain show flow. Skull and upper cervical spine: Negative Sinuses/Orbits: Clear/normal Other: None IMPRESSION: No acute finding by MRI. No specific cause of seizure identified. Extensive chronic small-vessel ischemic changes throughout the brain as outlined above, similar to the study of last year. Electronically Signed   By: Nelson Chimes M.D.   On: 11/30/2017 11:27     Subjective: Alert, oriented improving   Discharge Exam: Vitals:   12/01/17 2103 12/02/17 0415  BP: 129/69 (!) 151/84  Pulse: 76 76  Resp: 18 18  Temp: 98 F (36.7 C) 98.1 F (36.7 C)  SpO2: 95% 96%   Vitals:   12/01/17 1403 12/01/17 2103 12/02/17 0415 12/02/17 0500  BP: 118/70 129/69 (!) 151/84   Pulse: 86 76 76   Resp: 16 18 18    Temp: 98.3 F (36.8 C) 98 F (36.7 C) 98.1 F (36.7 C)   TempSrc: Oral Oral Oral   SpO2: 95% 95% 96%   Weight:    74 kg (163 lb 3.2 oz)  Height:        General: Pt is  alert, awake, not in acute distress Cardiovascular: RRR, S1/S2 +, no rubs, no gallops Respiratory: CTA bilaterally, no wheezing, no rhonchi Abdominal: Soft, NT, ND, bowel sounds + Extremities: no edema, no cyanosis    The results of significant diagnostics from this hospitalization (including imaging, microbiology, ancillary and laboratory) are listed below for reference.     Microbiology: Recent Results (from the past 240 hour(s))  Urine culture     Status: Abnormal   Collection Time: 11/29/17  1:57 AM  Result Value Ref Range Status   Specimen Description URINE, CLEAN CATCH  Final   Special Requests NONE  Final   Culture <10,000 COLONIES/mL INSIGNIFICANT GROWTH (A)  Final   Report Status 11/30/2017 FINAL  Final  CSF culture     Status: None (Preliminary result)   Collection Time: 12/01/17  2:49  PM  Result Value Ref Range Status   Specimen Description CSF  Final   Special Requests NONE  Final   Gram Stain NO WBC SEEN NO ORGANISMS SEEN CYTOSPIN SMEAR   Final   Culture NO GROWTH 1 DAY  Final   Report Status PENDING  Incomplete     Labs: BNP (last 3 results) No results for input(s): BNP in the last 8760 hours. Basic Metabolic Panel: Recent Labs  Lab 11/29/17 2029 11/30/17 0529 12/01/17 0325 12/01/17 1207 12/02/17 0313  NA 130* 132* 128* 128* 131*  K 4.4 4.6 4.6 4.5 4.4  CL 95* 97* 93* 95* 93*  CO2 23 23 25 23 27   GLUCOSE 161* 96 93 104* 97  BUN 15 13 10 15 17   CREATININE 1.14* 1.14* 1.04* 0.97 1.16*  CALCIUM 8.4* 8.7* 9.0 9.3 9.3  PHOS  --   --  3.7  --  3.5   Liver Function Tests: Recent Labs  Lab 11/28/17 1801 12/01/17 0325 12/02/17 0313  AST 32  --   --   ALT 25  --   --   ALKPHOS 62  --   --   BILITOT 0.5  --   --   PROT 6.3*  --   --   ALBUMIN 3.4* 3.1* 3.3*   No results for input(s): LIPASE, AMYLASE in the last 168 hours. Recent Labs  Lab 11/29/17 1051  AMMONIA 29   CBC: Recent Labs  Lab 11/28/17 1801 11/29/17 0916 12/01/17 0325  12/02/17 0313  WBC 12.0* 7.6 8.5 9.3  NEUTROABS  --  5.1  --   --   HGB 13.1 12.0 12.8 13.8  HCT 37.8 35.3* 37.9 40.0  MCV 87.7 87.8 88.8 88.3  PLT 349 314 352 366   Cardiac Enzymes: No results for input(s): CKTOTAL, CKMB, CKMBINDEX, TROPONINI in the last 168 hours. BNP: Invalid input(s): POCBNP CBG: Recent Labs  Lab 11/28/17 1800  GLUCAP 103*   D-Dimer No results for input(s): DDIMER in the last 72 hours. Hgb A1c No results for input(s): HGBA1C in the last 72 hours. Lipid Profile No results for input(s): CHOL, HDL, LDLCALC, TRIG, CHOLHDL, LDLDIRECT in the last 72 hours. Thyroid function studies Recent Labs    11/30/17 1404  TSH 2.636   Anemia work up Recent Labs    11/30/17 1404  VITAMINB12 1,182*   Urinalysis    Component Value Date/Time   COLORURINE STRAW (A) 11/29/2017 0157   APPEARANCEUR CLEAR 11/29/2017 0157   LABSPEC 1.008 11/29/2017 0157   PHURINE 7.0 11/29/2017 0157   GLUCOSEU NEGATIVE 11/29/2017 0157   HGBUR NEGATIVE 11/29/2017 0157   BILIRUBINUR NEGATIVE 11/29/2017 0157   KETONESUR NEGATIVE 11/29/2017 0157   PROTEINUR NEGATIVE 11/29/2017 0157   NITRITE NEGATIVE 11/29/2017 0157   LEUKOCYTESUR SMALL (A) 11/29/2017 0157   Sepsis Labs Invalid input(s): PROCALCITONIN,  WBC,  LACTICIDVEN Microbiology Recent Results (from the past 240 hour(s))  Urine culture     Status: Abnormal   Collection Time: 11/29/17  1:57 AM  Result Value Ref Range Status   Specimen Description URINE, CLEAN CATCH  Final   Special Requests NONE  Final   Culture <10,000 COLONIES/mL INSIGNIFICANT GROWTH (A)  Final   Report Status 11/30/2017 FINAL  Final  CSF culture     Status: None (Preliminary result)   Collection Time: 12/01/17  2:49 PM  Result Value Ref Range Status   Specimen Description CSF  Final   Special Requests NONE  Final   Gram Stain NO  WBC SEEN NO ORGANISMS SEEN CYTOSPIN SMEAR   Final   Culture NO GROWTH 1 DAY  Final   Report Status PENDING  Incomplete      Time coordinating discharge: Over 30 minutes  SIGNED:   Elmarie Shiley, MD  [facility] 12/02/2017, 2:58 PM Pager   If 7PM-7AM, please contact night-coverage [email or web address] Password [email or web address]

## 2017-12-02 NOTE — Progress Notes (Signed)
Discharge is complete. IV removed. Tele box removed. Paperwork printed. Waiting for CM to find HH/PT for patient in [facility].

## 2017-12-02 NOTE — Evaluation (Signed)
Physical Therapy Evaluation Patient Details Name: Bonnie Cook MRN: [id number] DOB: November 22, 1950 Today's Date: 12/02/2017   History of Present Illness  Pt is a 67 y/o female admitted secondary to confusion and HA. MRI was negative for any acute findings. PMH including but not limited to DM and HTN.  Clinical Impression  Pt presented supine in bed with HOB elevated, awake and willing to participate in therapy session. Pt's daughter present throughout session as well and provided helpful information. Prior to admission, pt lived alone and was independent with all functional mobility. Pt currently requires constant min A with ambulation secondary to poor awareness and for safety. Pt with LOB x2 requiring min A to maintain upright. Pt with very poor motor planning and coordination, and therefore do not feel an AD would be beneficial to her, especially with her other cognitive deficits as well. Pt's daughter reported that pt will be going to live with her and will have 24/7 supervision/assistance. Pt would continue to benefit from skilled physical therapy services at this time while admitted and after d/c to address the below listed limitations in order to improve overall safety and independence with functional mobility.     Follow Up Recommendations Home health PT;Supervision/Assistance - 24 hour    Equipment Recommendations  None recommended by PT    Recommendations for Other Services       Precautions / Restrictions Precautions Precautions: Fall Restrictions Weight Bearing Restrictions: No      Mobility  Bed Mobility Overal bed mobility: Modified Independent                Transfers Overall transfer level: Needs assistance Equipment used: None Transfers: Sit to/from Stand Sit to Stand: Supervision         General transfer comment: supervision for safety  Ambulation/Gait Ambulation/Gait assistance: Min assist Ambulation Distance (Feet): 200 Feet Assistive device:  None Gait Pattern/deviations: Decreased step length - right;Decreased step length - left;Decreased stride length;Step-through pattern;Drifts right/left Gait velocity: decreased Gait velocity interpretation: Below normal speed for age/gender General Gait Details: pt with very poor safety awareness and awareness of her surroundings. Pt requiring constant min A with LOB x2   Stairs            Wheelchair Mobility    Modified Rankin (Stroke Patients Only)       Balance Overall balance assessment: Needs assistance Sitting-balance support: Feet supported Sitting balance-Leahy Scale: Good     Standing balance support: During functional activity;No upper extremity supported Standing balance-Leahy Scale: Poor Standing balance comment: static is fair, dynamic is poor                             Pertinent Vitals/Pain Pain Assessment: No/denies pain    Home Living Family/patient expects to be discharged to:: Private residence Living Arrangements: Children Available Help at Discharge: Family;Available 24 hours/day Type of Home: House Home Access: Stairs to enter Entrance Stairs-Rails: Psychiatric nurse of Steps: 2 Home Layout: One level Home Equipment: None      Prior Function Level of Independence: Independent               Hand Dominance        Extremity/Trunk Assessment   Upper Extremity Assessment Upper Extremity Assessment: RUE deficits/detail;LUE deficits/detail RUE Deficits / Details: poor motor coordination with finger-to-nose testing LUE Deficits / Details: very poor motor coordination with finger-to-nose testing, pt also limited by poor motor planning    Lower  Extremity Assessment Lower Extremity Assessment: RLE deficits/detail;LLE deficits/detail RLE Deficits / Details: very poor motor planning and coordination with heel-to-shin  LLE Deficits / Details: very poor motor planning and coordination with heel-to-shin         Communication   Communication: No difficulties  Cognition Arousal/Alertness: Awake/alert Behavior During Therapy: WFL for tasks assessed/performed Overall Cognitive Status: Impaired/Different from baseline Area of Impairment: Memory;Following commands;Safety/judgement;Problem solving                     Memory: Decreased short-term memory Following Commands: Follows one step commands consistently Safety/Judgement: Decreased awareness of deficits;Decreased awareness of safety   Problem Solving: Slow processing;Difficulty sequencing;Requires verbal cues;Requires tactile cues        General Comments      Exercises     Assessment/Plan    PT Assessment Patient needs continued PT services  PT Problem List Decreased balance;Decreased mobility;Decreased coordination;Decreased cognition;Decreased safety awareness       PT Treatment Interventions DME instruction;Gait training;Stair training;Therapeutic activities;Functional mobility training;Therapeutic exercise;Balance training;Neuromuscular re-education;Patient/family education    PT Goals (Current goals can be found in the Care Plan section)  Acute Rehab PT Goals Patient Stated Goal: return home PT Goal Formulation: With patient/family Time For Goal Achievement: 12/16/17 Potential to Achieve Goals: Good    Frequency Min 3X/week   Barriers to discharge        Co-evaluation               AM-PAC PT "6 Clicks" Daily Activity  Outcome Measure Difficulty turning over in bed (including adjusting bedclothes, sheets and blankets)?: None Difficulty moving from lying on back to sitting on the side of the bed? : None Difficulty sitting down on and standing up from a chair with arms (e.g., wheelchair, bedside commode, etc,.)?: None Help needed moving to and from a bed to chair (including a wheelchair)?: A Little Help needed walking in hospital room?: A Little Help needed climbing 3-5 steps with a railing? : A Lot 6  Click Score: 20    End of Session Equipment Utilized During Treatment: Gait belt Activity Tolerance: Patient tolerated treatment well Patient left: in chair;with call bell/phone within reach;with family/visitor present Nurse Communication: Mobility status PT Visit Diagnosis: Other abnormalities of gait and mobility (R26.89)    Time: 6384-5364 PT Time Calculation (min) (ACUTE ONLY): 15 min   Charges:   PT Evaluation $PT Eval Moderate Complexity: 1 Mod     PT G Codes:        [place], PT, DPT [place] 12/02/2017, 2:03 PM

## 2017-12-03 LAB — HERPES SIMPLEX VIRUS(HSV) DNA BY PCR
HSV 1 DNA: NEGATIVE
HSV 2 DNA: NEGATIVE

## 2017-12-04 ENCOUNTER — Ambulatory Visit: Payer: PPO | Admitting: Neurology

## 2017-12-04 ENCOUNTER — Encounter: Payer: Self-pay | Admitting: Neurology

## 2017-12-04 VITALS — BP 128/73 | HR 91 | Ht 63.0 in | Wt 165.6 lb

## 2017-12-04 DIAGNOSIS — R519 Headache, unspecified: Secondary | ICD-10-CM

## 2017-12-04 DIAGNOSIS — R4189 Other symptoms and signs involving cognitive functions and awareness: Secondary | ICD-10-CM

## 2017-12-04 DIAGNOSIS — I10 Essential (primary) hypertension: Secondary | ICD-10-CM | POA: Diagnosis not present

## 2017-12-04 DIAGNOSIS — E7800 Pure hypercholesterolemia, unspecified: Secondary | ICD-10-CM | POA: Diagnosis not present

## 2017-12-04 DIAGNOSIS — R51 Headache: Secondary | ICD-10-CM

## 2017-12-04 DIAGNOSIS — R569 Unspecified convulsions: Secondary | ICD-10-CM | POA: Diagnosis not present

## 2017-12-04 DIAGNOSIS — E871 Hypo-osmolality and hyponatremia: Secondary | ICD-10-CM | POA: Diagnosis not present

## 2017-12-04 LAB — CSF CULTURE: GRAM STAIN: NONE SEEN

## 2017-12-04 LAB — CSF CULTURE W GRAM STAIN: Culture: NO GROWTH

## 2017-12-04 MED ORDER — BUTALBITAL-APAP-CAFFEINE 50-325-40 MG PO TABS: ORAL_TABLET | ORAL | 1 refills | Status: AC

## 2017-12-04 MED ORDER — DONEPEZIL HCL 10 MG PO TABS: ORAL_TABLET | ORAL | 4 refills | Status: AC

## 2017-12-04 MED ORDER — DONEPEZIL HCL 5 MG PO TABS: ORAL_TABLET | ORAL | 0 refills | Status: DC

## 2017-12-04 NOTE — Patient Instructions (Addendum)
-   continue ASA for stroke prevention - continue keppra for seizure control - will check blood for sodium level today - follow up with PCP for UTI prevention - will refill fioricet for HA treatment, no more than 2 a day and no more than 5 a week.  - will refer to neuropsych testing  - will start aricept 5mg  once a day in the morning with swallow for a month and then 10mg  daily after - recommend to avoid driving at this time until neuropsych testing done - avoid fatigue and anxiety.  - follow up in 3 months with me.

## 2017-12-04 NOTE — Progress Notes (Signed)
STROKE NEUROLOGY FOLLOW UP NOTE  NAME: Bonnie Cook DOB: [birth year]  REASON FOR VISIT: stroke follow up HISTORY FROM: pt and daughter  Today we had the pleasure of seeing Bonnie Cook in follow-up at our Neurology Clinic. Pt was accompanied by daughter.   History Summary Bonnie Cook is a 67 y.o. female with history of HTN, DM, HLD and asthma admitted on 01/22/17 for expressive aphasia, nausea, brief right hand numbness followed by HA. She did not receive IV t-PA due to symptoms resolved. CT no acute abnormality and MRI negative for acute stroke but old left BG lacune. MRA, CUS, TTE unremarkable. LDL 132 and A1C 5.6. EEG showed left temporal slowing but no seizure. Recommend repeat EEG as outpt. Her symptoms resolved, but concern for complicated migraine vs. Seizure. Continued on ASA on discharge.   03/01/17 follow up - pt stated that she continued to have two more episode of HA. 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 She fell to the ground. She was sent home, had HA bifrontal lasted from 7:30pm to 11pm until she went to sleep. Second day morning, she felt good, went to work, at 8:30am, she had pounding HA, b/l temporal frontal region, denies photo and phono phobia, no N/V. Went to see her PCP, gave a shot and prescribed fioricet. She went home and slept and HA resolved after awaken at 4pm. Since then, she has had no more HA episode. She denies migraine history, but her daughter has migraine. She admitted that she has some recent stress and about to retire in 2 weeks.  Her LDL still high, but not able to tolerate statins in the past including lipitor, crestor, zocor, and pravastatin. All statins "kill her legs".    04/12/17 follow up - she has been doing well. No stroke like symptoms. She had one mild b/l temporal HA on 03/25/17 while waiting at a funeral. She took fioricet and HA went away.  Other than that, she feels good, no HA. She has not call [employer] company yet for their financial program. Her ESR and CRP both normal and EEG repeat was normal too. BP today 136/76.  08/29/17 follow-up - she has been doing well until 3 weeks ago she had 2 back-to-back trips which made her exhausted. Up coming back home, she had headache with nausea and feeling sick. During that time, daughter and son found patient had several episodes of confusion. She was not able to figure out how to troubleshooting why her TV did not work, how to set up alarm clock, woke up talking confused. They checked her blood pressure and sugar were normal. She went to see PCP had extensive blood work which were all negative. For the last 1 week, she felt much better and no more such episodes. BP today 116/76.  Admission 10/16/17 - for episodes of right-sided numbness and aphasia.  Second episode of prolonged, triggered code stroke.  Patient received a TPA. MRI showed no acute stroke but known old stroke at left BG. Her headache resolved over night with fioricet and her aphasia resolved also over night. EEG again showed left temporal slowing.  Discharge with Depakote 500 mg nightly for headache prevention.  Admission 11/28/17 - admitted for confusion, headache, and word finding difficulty.  Exam continued to show moderate cognitive impairment.  UA WBC 6-30, treated with Abx. Sodium 123, concerning for  related to Depakote.  MRI with and without contrast no acute abnormality.  Initial spot EEG concerning for NCSE, however, LTM EEG after ativan showed diffuse slowing.  LP performed and CSF unremarkable.  Depakote discontinued and changed to Keppra 500 mg twice daily.  Patient discharged near her baseline.  Interval History During the interval time, patient discharged 2 days ago from hospital.  No headache, family found patient to be more close to her baseline, but still not 100%.  Patient continued to have cognitive impairment,  progressed from prior.  Finished antibiotics for UTI treatment.   REVIEW OF SYSTEMS: Full 14 system review of systems performed and notable only for those listed below and in HPI above, all others are negative:  Constitutional: Activity change, appetite change Cardiovascular:  Ear/Nose/Throat:   Skin:  Eyes:   Respiratory:   Gastroitestinal:   Genitourinary:  Hematology/Lymphatic:   Endocrine:  Musculoskeletal:   Allergy/Immunology:   Neurological:  Headache, memory loss Psychiatric: Confusion Sleep:   The following represents the patient's updated allergies and side effects list: Allergies  Allergen Reactions  . Erythromycin Base Other (See Comments)    GI Upset  . Penicillins Rash    From childhood: Has patient had a PCN reaction causing immediate rash, facial/tongue/throat swelling, SOB or lightheadedness with hypotension: Yes Has patient had a PCN reaction causing severe rash involving mucus membranes or skin necrosis: Unk Has patient had a PCN reaction that required hospitalization: Unk Has patient had a PCN reaction occurring within the last 10 years: No If all of the above answers are "NO", then may proceed with Cephalosporin use.      The neurologically relevant items on the patient's problem list were reviewed on today's visit.  Neurologic Examination  A problem focused neurological exam (12 or more points of the single system neurologic examination, vital signs counts as 1 point, cranial nerves count for 8 points) was performed.  Blood pressure 128/73, pulse 91, height 5' 3"  (1.6 m), weight 165 lb 9.6 oz (75.1 kg).  General - Well nourished, well developed, in no apparent distress.  Ophthalmologic - Sharp disc margins OU.  Cardiovascular - Regular rate and rhythm with no murmur.  Mental Status -  Level of arousal and orientation to month, place, and person were intact, not orientated to year. Language including expression, naming, repetition, comprehension  was assessed and found intact. Fund of Knowledge was assessed and was intact.  [place] visuospatial - executive 2/5 Naming - 3/3 Memory -  Attention - 1/2, 0/1, 0/3 Language - 2/2, 0/1 Abstraction - 1/2 Delayed recall - 0/5 Orientation - 5/6 Total - 14/30  Cranial Nerves II - XII - II - Visual field intact OU. III, IV, VI - Extraocular movements intact. V - Facial sensation intact bilaterally. VII - Facial movement intact bilaterally. VIII - Hearing & vestibular intact bilaterally. X - Palate elevates symmetrically. XI - Chin turning & shoulder shrug intact bilaterally. XII - Tongue protrusion intact.  Motor Strength - The patient's strength was normal in all extremities and pronator drift was absent.  Bulk was normal and fasciculations were absent.   Motor Tone - Muscle tone was assessed at the neck and appendages and was normal.  Reflexes - The patient's reflexes were 1+ in all extremities and she had no pathological reflexes.  Sensory - Light touch, temperature/pinprick were assessed and were normal.    Coordination - The patient had normal movements in the hands with no ataxia or dysmetria.  Tremor was absent.  Gait and Station - The patient's transfers, posture, gait, station, and turns were observed as normal.  Data reviewed: I personally reviewed the images and agree with the radiology interpretations.  Ct Head Code Stroke W/o Cm 01/22/2017 1. Remote infarct involving the anterior limb of the left internal capsule with associated ex vacuo dilation of the left lateral ventricle. 2. Moderate atrophy and white matter disease. 3. No acute intracranial abnormality. 4. ASPECTS is 10/10   Mr Brain Wo Contrast 01/23/2017 1. No acute intracranial process. 2. Remote left basal ganglia lacunar infarct. 3. Mild chronic microvascular ischemic disease.   Mr Jodene Nam Head/brain Wo Cm 01/23/2017 Normal intracranial MRA.   CUS -  Findings are consistent with a 1-39 percent stenosis  involving the right internal carotid artery and the left internal carotid artery. The vertebral arteries demonstrate antegrade flow.  EEG -  This awake and asleep EEG is abnormal due to occasional focal slowing over the left temporalregion. Clinical Correlation of the above findings indicates focal cerebral dysfunction over the left temporalregion suggestive of underlying structural or physiologic abnormality. The absence of epileptiform discharges does not exclude a clinical diagnosis of epilepsy. Clinical correlation is advised.  TTE - Left ventricle: The cavity size was normal. Wall thickness was   normal. Systolic function was normal. The estimated ejection   fraction was in the range of 60% to 65%. Wall motion was normal;   there were no regional wall motion abnormalities. There was no   evidence of elevated ventricular filling pressure by Doppler   parameters. Impressions: - No cardiac source of emboli was indentified.  EEG 03/13/17  This is a normal EEG recording in the waking state. No evidence of ictal or interictal discharges are seen.  Sleep study - no significant OSA  Ct Head Wo Contrast 10/17/2017 IMPRESSION: 1. No acute intracranial process. 2. Mild parenchymal brain volume loss for age. 3. Old LEFT basal ganglia infarct. Mild to moderate chronic small vessel ischemic disease.   Mr Brain Wo Contrast 10/16/2017 IMPRESSION: 1. No acute intracranial process on limited MR stroke protocol. 2. Mild parenchymal brain volume loss for age. 3. Old LEFT basal ganglia infarct. Mild to moderate chronic small vessel ischemic disease.   Ct Head Code Stroke Wo Contrast 10/16/2017 IMPRESSION: 1. No acute intracranial abnormality. Chronic microvascular ischemic changes, left greater than right. 2. ASPECTS is 10   EEG 10/17/17 This awake and drowsy EEG is abnormal due to: 1. diffuse slowing of the waking background. 2.  Superimposed left hemispheric slowing Clinical Correlation of the  above findings indicates: 1. diffuse cerebral dysfunction that is non-specific in etiology and can be seen with hypoxic/ischemic injury, toxic/metabolic encephalopathies, neurodegenerative disorders, or medication effect.   2.  Structural or physiologic abnormality in the left hemisphere.  Mr Bonnie Cook Wo Contrast 11/30/2017 IMPRESSION: No acute finding by MRI. No specific cause of seizure identified. Extensive chronic small-vessel ischemic changes throughout the brain as outlined above, similar to the study of last year.  EEG 10/29/18-  This is an abnormal EEG. Multiple episodes of paroxysmal generalized fast activity, with susbsequent generalized rhythmic slowing, were seen throughout the tracing which could imply the possibility of non-convulsive, electrographic seizures. At times, the faster activity seemed to have a right hemispheric predominance. There were no definite clinical manifestations for the episodes, however clinical correlation is advised in regards to ongoing use of anticonvulsant agents, or possibly long term video EEG monitoring.  LTM EEG 10/30/18 This is an abnormal EEG due to the presence  of intermittent generalized theta slowing during wakefulness, suggesting mild encephalopathy, which is nonspecific as to etiology. No epileptiform discharge or seizure was in evidence.   Component     Latest Ref Rng & Units 01/23/2017  Cholesterol     0 - 200 mg/dL 207 (H)  Triglycerides     <150 mg/dL 67  HDL Cholesterol     >40 mg/dL 62  Total CHOL/HDL Ratio     RATIO 3.3  VLDL     0 - 40 mg/dL 13  LDL (calc)     0 - 99 mg/dL 132 (H)  Hemoglobin A1C     4.8 - 5.6 % 5.6  Mean Plasma Glucose     mg/dL 114   Component     Latest Ref Rng & Units 03/01/2017  Sed Rate     0 - 40 mm/hr 2  CRP     0.0 - 4.9 mg/L 2.2   08/22/17 - A1c 5.7, B-12 905, TSH 4.03  Component     Latest Ref Rng & Units 10/17/2017 11/29/2017 11/30/2017            Tube #          Color, CSF      COLORLESS     Appearance, CSF     CLEAR     Supernatant          RBC Count, CSF     0 /cu mm     WBC, CSF     0 - 5 /cu mm     Other Cells, CSF          Cholesterol     0 - 200 mg/dL 186    Triglycerides     <150 mg/dL 75    HDL Cholesterol     >40 mg/dL 70    Total CHOL/HDL Ratio     RATIO 2.7    VLDL     0 - 40 mg/dL 15    LDL (calc)     0 - 99 mg/dL 101 (H)    Specimen Description          Special Requests          Gram Stain          Culture          Report Status          Specimen source hsv          HSV 1 DNA     Negative     HSV 2 DNA     Negative     Hemoglobin A1C     4.8 - 5.6 % 5.7 (H) 5.8 (H)   Mean Plasma Glucose     mg/dL 116.89 119.76   Glucose, CSF     40 - 70 mg/dL     Total  Protein, CSF     15 - 45 mg/dL     Crypto Ag     NEGATIVE     Cryptococcal Ag Titer     NOT INDICATED     Ammonia     9 - 35 umol/L  29   TSH     0.350 - 4.500 uIU/mL   2.636  Vitamin B12     180 - 914 pg/mL   1,182 (H)  Vitamin B1 (Thiamine)     66.5 - 200.0 nmol/L   124.7  RPR     Non Reactive   Non Reactive   Component  Latest Ref Rng & Units 12/01/2017         2:47 PM  Tube #      4  Color, CSF     COLORLESS COLORLESS  Appearance, CSF     CLEAR CLEAR  Supernatant      NOT INDICATED  RBC Count, CSF     0 /cu mm 0  WBC, CSF     0 - 5 /cu mm 0  Other Cells, CSF      TOO FEW TO COUNT, SMEAR AVAILABLE FOR REVIEW  Cholesterol     0 - 200 mg/dL   Triglycerides     <150 mg/dL   HDL Cholesterol     >40 mg/dL   Total CHOL/HDL Ratio     RATIO   VLDL     0 - 40 mg/dL   LDL (calc)     0 - 99 mg/dL   Specimen Description        Special Requests        Gram Stain        Culture        Report Status        Specimen source hsv      CSF  HSV 1 DNA     Negative Negative  HSV 2 DNA     Negative Negative  Hemoglobin A1C     4.8 - 5.6 %   Mean Plasma Glucose     mg/dL   Glucose, CSF     40 - 70 mg/dL 59  Total  Protein, CSF     15  - 45 mg/dL 17  Crypto Ag     NEGATIVE NEGATIVE  Cryptococcal Ag Titer     NOT INDICATED NOT INDICATED  Ammonia     9 - 35 umol/L   TSH     0.350 - 4.500 uIU/mL   Vitamin B12     180 - 914 pg/mL   Vitamin B1 (Thiamine)     66.5 - 200.0 nmol/L   RPR     Non Reactive    Component     Latest Ref Rng & Units 12/01/2017         2:47 PM  Tube #      1  Color, CSF     COLORLESS COLORLESS  Appearance, CSF     CLEAR CLEAR  Supernatant      NOT INDICATED  RBC Count, CSF     0 /cu mm 94 (H)  WBC, CSF     0 - 5 /cu mm 0  Other Cells, CSF      TOO FEW TO COUNT, SMEAR AVAILABLE FOR REVIEW  Cholesterol     0 - 200 mg/dL   Triglycerides     <150 mg/dL   HDL Cholesterol     >40 mg/dL   Total CHOL/HDL Ratio     RATIO   VLDL     0 - 40 mg/dL   LDL (calc)     0 - 99 mg/dL   Specimen Description        Special Requests        Gram Stain        Culture        Report Status        Specimen source hsv        HSV 1 DNA     Negative   HSV 2 DNA     Negative   Hemoglobin A1C  4.8 - 5.6 %   Mean Plasma Glucose     mg/dL   Glucose, CSF     40 - 70 mg/dL   Total  Protein, CSF     15 - 45 mg/dL   Crypto Ag     NEGATIVE   Cryptococcal Ag Titer     NOT INDICATED   Ammonia     9 - 35 umol/L   TSH     0.350 - 4.500 uIU/mL   Vitamin B12     180 - 914 pg/mL   Vitamin B1 (Thiamine)     66.5 - 200.0 nmol/L   RPR     Non Reactive    Component     Latest Ref Rng & Units 12/01/2017         2:49 PM  Tube #        Color, CSF     COLORLESS   Appearance, CSF     CLEAR   Supernatant        RBC Count, CSF     0 /cu mm   WBC, CSF     0 - 5 /cu mm   Other Cells, CSF        Cholesterol     0 - 200 mg/dL   Triglycerides     <150 mg/dL   HDL Cholesterol     >40 mg/dL   Total CHOL/HDL Ratio     RATIO   VLDL     0 - 40 mg/dL   LDL (calc)     0 - 99 mg/dL   Specimen Description      CSF  Special Requests      NONE  Gram Stain      NO WBC SEEN . . .    Culture      NO GROWTH 3 DAYS  Report Status      12/04/2017 FINAL  Specimen source hsv        HSV 1 DNA     Negative   HSV 2 DNA     Negative   Hemoglobin A1C     4.8 - 5.6 %   Mean Plasma Glucose     mg/dL   Glucose, CSF     40 - 70 mg/dL   Total  Protein, CSF     15 - 45 mg/dL   Crypto Ag     NEGATIVE   Cryptococcal Ag Titer     NOT INDICATED   Ammonia     9 - 35 umol/L   TSH     0.350 - 4.500 uIU/mL   Vitamin B12     180 - 914 pg/mL   Vitamin B1 (Thiamine)     66.5 - 200.0 nmol/L   RPR     Non Reactive     Assessment: As you may recall, she is a 67 y.o. Caucasian female with PMH of HTN, DM, HLD and asthma admitted on 01/22/17 for expressive aphasia, nausea, brief right hand numbness followed by HA. CT no acute abnormality and MRI negative for acute stroke but old left BG lacune. MRA, CUS, TTE unremarkable. LDL 132 and A1C 5.6. EEG showed left temporal slowing but no seizure. Her symptoms resolved, but concern for complicated migraine vs. Seizure. Recommend repeat EEG as outpt. Continued on ASA on discharge. After discharge, she had three more episode of HA, one associated with left hand and left leg weakness. HA resolved after sleep or fioricet. She denies photo and phono  phobia, no N/V, migraine history, but her daughter and uncle have migraine. Condition still more consistent with complicated migraine. Her LDL still high, but not able to tolerate statins in the past including lipitor, crestor, zocor, and pravastatin. Pending company financial program. ESR and CRP WNL and repeat EEG normal 02/2017.   In 07/2017, developed confusion episode in the setting of extreme fatigue and headache, consistent with decreased brain reserve under stress. However, [place] 19/30, more significant in memory domain.  Admitted in 10/2017 for headache and aphasia. Received tPA. EEG continue to show left temporal slowing. Admitted in 11/2017 for confusion in the setting of UTI and hyponatremia. MRI  with and without contrast negative but initially EEG concerning for NCSE but later LTM EEG after ativan general slowing. CSF neg. Changed depakote to keppra. Na improved. So far tolerating keppra. However, MOCA down to 14/30, more in memory and visuospatial domains. Will need neuropsych testing and aricept.   Plan:  - continue ASA for stroke prevention - continue keppra for seizure control - will check blood for Na level today - follow up with PCP for UTI prevention - will refill fioricet for HA treatment, no more than 2 a day and no more than 5 a week.  - will refer to neuropsych testing  - will start aricept 887m once a day in the morning with swallow for a month and then 1687mdaily after - recommend to avoid driving at this time until neuropsych testing done - avoid fatigue and anxiety.  - follow up in 3 months with me.    I spent more than 25 minutes of face to face time with the patient. Greater than 50% of time was spent in counseling and coordination of care. We discussed about dementia management, further testing and initiation of medication.    Orders Placed This Encounter  Procedures  . Basic metabolic panel  . Ambulatory referral to Neuropsychology    Referral Priority:   Routine    Referral Type:   Psychiatric    Referral Reason:   Specialty Services Required    Requested Specialty:   Psychology    Number of Visits Requested:   1    Meds ordered this encounter  Medications  . donepezil (ARICEPT) 5 MG tablet    Sig: One table daily for one month, take in the morning with breakfast.    Dispense:  30 tablet    Refill:  0  . donepezil (ARICEPT) 10 MG tablet    Sig: Take one tablet daily in the morning with breakfast in one month after finishing the 87m42mablets    Dispense:  30 tablet    Refill:  4  . butalbital-acetaminophen-caffeine (FIORICET, ESGIC) 50-325-40 MG tablet    Sig: Take for HA abortion Q12h PRN. No more than 2 in a day and no more than 5 a week.     Dispense:  30 tablet    Refill:  1    Patient Instructions  - continue ASA for stroke prevention - continue keppra for seizure control - will check blood for sodium level today - follow up with PCP for UTI prevention - will refill fioricet for HA treatment, no more than 2 a day and no more than 5 a week.  - will refer to neuropsych testing  - will start aricept 87mg22mce a day in the morning with swallow for a month and then 10mg102mly after - recommend to avoid driving at this time until neuropsych testing done -  avoid fatigue and anxiety.  - follow up in 3 months with me.   Rosalin Hawking, MD PhD [facility] [address], [place], [place] [address] [phone]

## 2017-12-05 ENCOUNTER — Telehealth: Payer: Self-pay

## 2017-12-05 LAB — BASIC METABOLIC PANEL
BUN / CREAT RATIO: 33 — AB (ref 12–28)
BUN: 33 mg/dL — AB (ref 8–27)
CO2: 25 mmol/L (ref 20–29)
Calcium: 10.1 mg/dL (ref 8.7–10.3)
Chloride: 99 mmol/L (ref 96–106)
Creatinine, Ser: 1 mg/dL (ref 0.57–1.00)
GFR calc Af Amer: 67 mL/min/{1.73_m2} (ref >59)
GFR, EST NON AFRICAN AMERICAN: 58 mL/min/{1.73_m2} — AB (ref >59)
Glucose: 102 mg/dL — ABNORMAL HIGH (ref 65–99)
Potassium: 5 mmol/L (ref 3.5–5.2)
Sodium: 138 mmol/L (ref 134–144)

## 2017-12-05 LAB — OLIGOCLONAL BANDS, CSF + SERM

## 2017-12-05 NOTE — Telephone Encounter (Signed)
Medication denied based on diagnose of complicated migraine which is not approve medically. RN call the expedited appeal line and spoke with Gabby at [phone]. Rn stated Dr .Erlinda Hong wanted to appeal the decision on the floricet. Janace Hoard stated she will send the information over to appeal department, and we should get a call within 4 hours. Rn stated our office is closed now. Janace Hoard stated a call will be made to our office next business day between 0900am and 1100 to do the appeal via phone.Dr. Erlinda Hong has been made aware.

## 2017-12-05 NOTE — Telephone Encounter (Signed)
Receive fax from [facility] that pt's insurance is requiring PA on butalbital-apap-caffeine(floricet). PA done on cover my meds, pending for approval or denial.

## 2017-12-06 ENCOUNTER — Telehealth: Payer: Self-pay

## 2017-12-06 LAB — VDRL, CSF: VDRL Quant, CSF: NONREACTIVE

## 2017-12-06 NOTE — Telephone Encounter (Signed)
-----   Message from Rosalin Hawking, MD sent at 12/05/2017  6:42 PM EST ----- Could you please let the patient know that the blood test done monday in our office showed normal sodium at 138 and normal kidney function. Numbers look great. Please continue current treatment. Thanks.  Rosalin Hawking, MD PhD Stroke Neurology 12/05/2017 6:41 PM

## 2017-12-06 NOTE — Telephone Encounter (Signed)
Advised patient's daughter of normal lab and kidney functions per previous message.  She understood results and was asking about fluid restrictions that patient had while hospitalized but she will check with patient's PCP.

## 2017-12-06 NOTE — Telephone Encounter (Signed)
[employer] call for appeal via phone call. Dr. Erlinda Hong spoke with appeal staff. Medication was approve 12/06/2017 to 11/13/2018.

## 2017-12-06 NOTE — Telephone Encounter (Signed)
Left vm for patients daughter Lelon Frohlich on dpr to call back about her moms lab work.

## 2017-12-06 NOTE — Telephone Encounter (Signed)
Talked with [employer] and they stated that fioricet is not approved for complicated migraine but for tension headache. I told them pt complicated migraine sometimes was triggered by tension headache and fioricet should be approved for this condition. They were in agreement with me. Fioricet was approved from 12/06/17 to 11/13/18.   Rosalin Hawking, MD PhD Stroke Neurology 12/06/2017 10:10 PM

## 2017-12-07 DIAGNOSIS — Z8673 Personal history of transient ischemic attack (TIA), and cerebral infarction without residual deficits: Secondary | ICD-10-CM | POA: Diagnosis not present

## 2017-12-07 DIAGNOSIS — Z7984 Long term (current) use of oral hypoglycemic drugs: Secondary | ICD-10-CM | POA: Diagnosis not present

## 2017-12-07 DIAGNOSIS — M1990 Unspecified osteoarthritis, unspecified site: Secondary | ICD-10-CM | POA: Diagnosis not present

## 2017-12-07 DIAGNOSIS — Z7982 Long term (current) use of aspirin: Secondary | ICD-10-CM | POA: Diagnosis not present

## 2017-12-07 DIAGNOSIS — E7800 Pure hypercholesterolemia, unspecified: Secondary | ICD-10-CM | POA: Diagnosis not present

## 2017-12-07 DIAGNOSIS — J45909 Unspecified asthma, uncomplicated: Secondary | ICD-10-CM | POA: Diagnosis not present

## 2017-12-07 DIAGNOSIS — E119 Type 2 diabetes mellitus without complications: Secondary | ICD-10-CM | POA: Diagnosis not present

## 2017-12-07 DIAGNOSIS — Z87440 Personal history of urinary (tract) infections: Secondary | ICD-10-CM | POA: Diagnosis not present

## 2017-12-07 DIAGNOSIS — G40909 Epilepsy, unspecified, not intractable, without status epilepticus: Secondary | ICD-10-CM | POA: Diagnosis not present

## 2017-12-07 DIAGNOSIS — I10 Essential (primary) hypertension: Secondary | ICD-10-CM | POA: Diagnosis not present

## 2017-12-07 DIAGNOSIS — Z9181 History of falling: Secondary | ICD-10-CM | POA: Diagnosis not present

## 2017-12-08 DIAGNOSIS — Z0000 Encounter for general adult medical examination without abnormal findings: Secondary | ICD-10-CM | POA: Diagnosis not present

## 2017-12-08 DIAGNOSIS — E871 Hypo-osmolality and hyponatremia: Secondary | ICD-10-CM | POA: Diagnosis not present

## 2017-12-08 DIAGNOSIS — Z87440 Personal history of urinary (tract) infections: Secondary | ICD-10-CM | POA: Diagnosis not present

## 2017-12-08 DIAGNOSIS — E1165 Type 2 diabetes mellitus with hyperglycemia: Secondary | ICD-10-CM | POA: Diagnosis not present

## 2017-12-08 DIAGNOSIS — I10 Essential (primary) hypertension: Secondary | ICD-10-CM | POA: Diagnosis not present

## 2017-12-08 LAB — ENTEROVIRUS PCR: Enterovirus PCR: NEGATIVE

## 2017-12-12 ENCOUNTER — Telehealth: Payer: Self-pay | Admitting: Neurology

## 2017-12-12 ENCOUNTER — Encounter: Payer: Self-pay | Admitting: Psychology

## 2017-12-12 NOTE — Telephone Encounter (Signed)
Pt is asking for a call from [place] re: the referral dicussed for pt

## 2017-12-13 NOTE — Telephone Encounter (Signed)
Pt's daughter called she is requesting the psyche referral to be sent to [facility]/Dr Mendel Corning (p) [phone] pt can be seen in March. She is asking for her number to be used as contact information: Chandra Batch [phone]

## 2017-12-18 NOTE — Telephone Encounter (Signed)
Spoke to Daughter referral has been sent.

## 2017-12-20 DIAGNOSIS — J0100 Acute maxillary sinusitis, unspecified: Secondary | ICD-10-CM | POA: Diagnosis not present

## 2017-12-20 DIAGNOSIS — F4323 Adjustment disorder with mixed anxiety and depressed mood: Secondary | ICD-10-CM | POA: Diagnosis not present

## 2017-12-25 ENCOUNTER — Other Ambulatory Visit: Payer: Self-pay

## 2017-12-25 ENCOUNTER — Telehealth: Payer: Self-pay | Admitting: Neurology

## 2017-12-25 DIAGNOSIS — J45909 Unspecified asthma, uncomplicated: Secondary | ICD-10-CM | POA: Diagnosis not present

## 2017-12-25 DIAGNOSIS — I10 Essential (primary) hypertension: Secondary | ICD-10-CM | POA: Diagnosis not present

## 2017-12-25 DIAGNOSIS — E119 Type 2 diabetes mellitus without complications: Secondary | ICD-10-CM | POA: Diagnosis not present

## 2017-12-25 MED ORDER — LEVETIRACETAM 500 MG PO TABS: 500.0000 mg | ORAL_TABLET | Freq: Two times a day (BID) | ORAL | 4 refills | Status: DC

## 2017-12-25 NOTE — Telephone Encounter (Signed)
Refill done for [facility] to [facility] per request.

## 2017-12-25 NOTE — Telephone Encounter (Signed)
Patient's daughter requesting Rx for levETIRAcetam (KEPPRA) 500 MG tablet [facility] [place].

## 2018-01-02 DIAGNOSIS — E782 Mixed hyperlipidemia: Secondary | ICD-10-CM | POA: Diagnosis not present

## 2018-01-02 NOTE — Telephone Encounter (Signed)
Rn receive fax from [employer]. Their contact number is [phone]. The letter states patient is eligible to receive Rep[employer] though the foundation from 12/20/2017 to 11/13/2018. PT has to continue enrollment once it expires to receive the services, and to re qualify.

## 2018-01-15 DIAGNOSIS — Z1231 Encounter for screening mammogram for malignant neoplasm of breast: Secondary | ICD-10-CM | POA: Diagnosis not present

## 2018-01-19 DIAGNOSIS — I10 Essential (primary) hypertension: Secondary | ICD-10-CM | POA: Diagnosis not present

## 2018-01-26 ENCOUNTER — Other Ambulatory Visit: Payer: Self-pay

## 2018-01-26 NOTE — Patient Outreach (Signed)
Telephone outreach to patient to obtain mRS was successfully completed. mRS = 0 

## 2018-02-06 ENCOUNTER — Emergency Department (HOSPITAL_COMMUNITY)
Admission: EM | Admit: 2018-02-06 | Discharge: 2018-02-06 | Disposition: A | Discharge status: Home / Self Care | Payer: PPO | Attending: Emergency Medicine | Admitting: Emergency Medicine

## 2018-02-06 ENCOUNTER — Encounter (HOSPITAL_COMMUNITY): Payer: Self-pay

## 2018-02-06 ENCOUNTER — Emergency Department (HOSPITAL_COMMUNITY): Payer: PPO

## 2018-02-06 ENCOUNTER — Other Ambulatory Visit: Payer: Self-pay

## 2018-02-06 DIAGNOSIS — Z7984 Long term (current) use of oral hypoglycemic drugs: Secondary | ICD-10-CM | POA: Diagnosis not present

## 2018-02-06 DIAGNOSIS — M25551 Pain in right hip: Secondary | ICD-10-CM | POA: Diagnosis not present

## 2018-02-06 DIAGNOSIS — Z79899 Other long term (current) drug therapy: Secondary | ICD-10-CM | POA: Diagnosis not present

## 2018-02-06 DIAGNOSIS — Z7982 Long term (current) use of aspirin: Secondary | ICD-10-CM | POA: Diagnosis not present

## 2018-02-06 DIAGNOSIS — G43109 Migraine with aura, not intractable, without status migrainosus: Secondary | ICD-10-CM

## 2018-02-06 DIAGNOSIS — J45909 Unspecified asthma, uncomplicated: Secondary | ICD-10-CM | POA: Insufficient documentation

## 2018-02-06 DIAGNOSIS — E119 Type 2 diabetes mellitus without complications: Secondary | ICD-10-CM | POA: Diagnosis not present

## 2018-02-06 DIAGNOSIS — G43909 Migraine, unspecified, not intractable, without status migrainosus: Secondary | ICD-10-CM | POA: Insufficient documentation

## 2018-02-06 DIAGNOSIS — T148XXA Other injury of unspecified body region, initial encounter: Secondary | ICD-10-CM | POA: Diagnosis not present

## 2018-02-06 DIAGNOSIS — I10 Essential (primary) hypertension: Secondary | ICD-10-CM | POA: Diagnosis not present

## 2018-02-06 DIAGNOSIS — M546 Pain in thoracic spine: Secondary | ICD-10-CM | POA: Diagnosis not present

## 2018-02-06 DIAGNOSIS — M542 Cervicalgia: Secondary | ICD-10-CM | POA: Diagnosis not present

## 2018-02-06 DIAGNOSIS — S199XXA Unspecified injury of neck, initial encounter: Secondary | ICD-10-CM | POA: Diagnosis not present

## 2018-02-06 DIAGNOSIS — R531 Weakness: Secondary | ICD-10-CM | POA: Diagnosis not present

## 2018-02-06 DIAGNOSIS — R51 Headache: Secondary | ICD-10-CM | POA: Diagnosis present

## 2018-02-06 DIAGNOSIS — S0990XA Unspecified injury of head, initial encounter: Secondary | ICD-10-CM | POA: Diagnosis not present

## 2018-02-06 LAB — COMPREHENSIVE METABOLIC PANEL
ALBUMIN: 4 g/dL (ref 3.5–5.0)
ALK PHOS: 63 U/L (ref 38–126)
ALT: 18 U/L (ref 14–54)
ANION GAP: 11 (ref 5–15)
AST: 28 U/L (ref 15–41)
BILIRUBIN TOTAL: 0.6 mg/dL (ref 0.3–1.2)
BUN: 18 mg/dL (ref 6–20)
CALCIUM: 9.4 mg/dL (ref 8.9–10.3)
CO2: 26 mmol/L (ref 22–32)
CREATININE: 1.02 mg/dL — AB (ref 0.44–1.00)
Chloride: 101 mmol/L (ref 101–111)
GFR calc Af Amer: >60 mL/min (ref >60)
GFR calc non Af Amer: 55 mL/min — ABNORMAL LOW (ref >60)
Glucose, Bld: 119 mg/dL — ABNORMAL HIGH (ref 65–99)
Potassium: 4.1 mmol/L (ref 3.5–5.1)
Sodium: 138 mmol/L (ref 135–145)
Total Protein: 7 g/dL (ref 6.5–8.1)

## 2018-02-06 LAB — CBC
HCT: 43 % (ref 36.0–46.0)
HEMOGLOBIN: 14.6 g/dL (ref 12.0–15.0)
MCH: 30 pg (ref 26.0–34.0)
MCHC: 34 g/dL (ref 30.0–36.0)
MCV: 88.3 fL (ref 78.0–100.0)
Platelets: 280 10*3/uL (ref 150–400)
RBC: 4.87 MIL/uL (ref 3.87–5.11)
RDW: 13.6 % (ref 11.5–15.5)
WBC: 8.3 10*3/uL (ref 4.0–10.5)

## 2018-02-06 LAB — I-STAT TROPONIN, ED: Troponin i, poc: 0.01 ng/mL (ref 0.00–0.08)

## 2018-02-06 LAB — DIFFERENTIAL
Basophils Absolute: 0 10*3/uL (ref 0.0–0.1)
Basophils Relative: 0 %
EOS PCT: 1 %
Eosinophils Absolute: 0.1 10*3/uL (ref 0.0–0.7)
LYMPHS ABS: 1.4 10*3/uL (ref 0.7–4.0)
LYMPHS PCT: 17 %
Monocytes Absolute: 0.6 10*3/uL (ref 0.1–1.0)
Monocytes Relative: 7 %
NEUTROS ABS: 6.2 10*3/uL (ref 1.7–7.7)
Neutrophils Relative %: 75 %

## 2018-02-06 LAB — PROTIME-INR
INR: 0.93
Prothrombin Time: 12.4 seconds (ref 11.4–15.2)

## 2018-02-06 LAB — I-STAT CHEM 8, ED
BUN: 22 mg/dL — ABNORMAL HIGH (ref 6–20)
CHLORIDE: 100 mmol/L — AB (ref 101–111)
CREATININE: 0.9 mg/dL (ref 0.44–1.00)
Calcium, Ion: 1.2 mmol/L (ref 1.15–1.40)
Glucose, Bld: 114 mg/dL — ABNORMAL HIGH (ref 65–99)
HEMATOCRIT: 44 % (ref 36.0–46.0)
Hemoglobin: 15 g/dL (ref 12.0–15.0)
POTASSIUM: 4 mmol/L (ref 3.5–5.1)
SODIUM: 137 mmol/L (ref 135–145)
TCO2: 26 mmol/L (ref 22–32)

## 2018-02-06 LAB — APTT: aPTT: 31 seconds (ref 24–36)

## 2018-02-06 LAB — CBG MONITORING, ED: GLUCOSE-CAPILLARY: 106 mg/dL — AB (ref 65–99)

## 2018-02-06 MED ORDER — KETOROLAC TROMETHAMINE 30 MG/ML IJ SOLN
15.0000 mg | Freq: Once | INTRAMUSCULAR | Status: AC
  Administered 2018-02-06: 15 mg via INTRAVENOUS
  Filled 2018-02-06: qty 1

## 2018-02-06 MED ORDER — PROMETHAZINE HCL 25 MG/ML IJ SOLN
12.5000 mg | Freq: Once | INTRAMUSCULAR | Status: AC
Start: 2018-02-06 — End: 2018-02-06
  Administered 2018-02-06: 12.5 mg via INTRAVENOUS
  Filled 2018-02-06: qty 1

## 2018-02-06 MED ORDER — DIPHENHYDRAMINE HCL 50 MG/ML IJ SOLN
12.5000 mg | Freq: Once | INTRAMUSCULAR | Status: AC
Start: 2018-02-06 — End: 2018-02-06
  Administered 2018-02-06: 12.5 mg via INTRAVENOUS
  Filled 2018-02-06: qty 1

## 2018-02-06 NOTE — ED Notes (Signed)
DC instructions given by Lennette Bihari, RN

## 2018-02-06 NOTE — ED Triage Notes (Signed)
Pt arrives EMS from home after fell in floor at home and lay in shower x 1 hour.Pt c/o pain and pressure on rising at 0930 at front of head and fell back down and lay there for another hour. Pt has right sided gaze but will follow to left side,. Weakness noted at left side. Hx of hemiparalysis migraine. Now c/o nausea.

## 2018-02-06 NOTE — ED Notes (Signed)
Patient transported to CT 

## 2018-02-06 NOTE — ED Notes (Signed)
Pt ambyulates to bathroom with steady gait but requires redirect to room.

## 2018-02-06 NOTE — ED Notes (Addendum)
Family concerned that pt had difficuty walking. Dr. Alvino Chapel notified and he returns to room. Then states pt is ready for discharge.

## 2018-02-06 NOTE — ED Provider Notes (Addendum)
[facility] EMERGENCY DEPARTMENT Provider Note   CSN: [id number] Arrival date & time: 02/06/18  1139     History   Chief Complaint Chief Complaint  Patient presents with  . Fall  . Headache  . Weakness    HPI Bonnie Cook is a 67 y.o. female.  HPI Patient presents with fall and neuro deficits.  Reportedly fell at home and laid in her shower.  Has had headache for most a day.  Has had some difficulty speaking.  Some left-sided weakness.  History of complicated migraines that have even gotten TPA in the past.  Now has nausea.  Thinks she may have hit her head.  Has been doing rather well otherwise.  Headache is typical for her migraines.  Mild neck pain. Past Medical History:  Diagnosis Date  . Arthritis    "hands, shoulders" (11/29/2017)  . Asthma   . Basal cell carcinoma    "burned off her face" (11/29/2017)  . Depression   . GERD (gastroesophageal reflux disease)   . History of kidney stones 1970s?  Marland Kitchen Hypercholesteremia   . Hypertension   . Lumbar spinal stenosis   . Migraine    "daily in the last week; usually < monthly" (11/29/2017)  . Seizures ([place])    "EEG showed seizure activity today; 2 previous EEGs in the past year didn't show any" (11/29/2017)  . Stroke [facility])    "saw evidence of an old old stroke 01/2017, 10/2017, 11/2017/CT" (11/29/2017)  . Type II diabetes mellitus [facility])     Patient Active Problem List   Diagnosis Date Noted  . Confusion   . Seizures ([place]) 11/30/2017  . Cognitive impairment 11/30/2017  . Acute metabolic encephalopathy 03/47/4259  . Hyponatremia 11/28/2017  . Chronic migraine 11/28/2017  . UTI (urinary tract infection) 11/28/2017  . History of stroke 10/16/2017  . MCI (mild cognitive impairment) 08/29/2017  . Abnormal EEG 03/02/2017  . Complicated migraine 56/38/7564  . Acute nonintractable headache 03/01/2017  . Essential hypertension   . Pure hypercholesterolemia   . Diabetes mellitus without complication ([place])   .  Word finding difficulty   . TIA (transient ischemic attack) 01/22/2017    Past Surgical History:  Procedure Laterality Date  . BACK SURGERY    . FINGER FRACTURE SURGERY Right 2014   "broke her little finger; put pins in it"  . FRACTURE SURGERY    . LUMBAR DISC SURGERY  ~ 1998   "ruptured discs"     OB History   None      Home Medications    Prior to Admission medications   Medication Sig Start Date End Date Taking? Authorizing Provider  albuterol (PROAIR HFA) 108 (90 Base) MCG/ACT inhaler Inhale 1-2 puffs into the lungs every 6 hours as needed for shortness of breath or wheezing   Yes [provider]  aspirin EC 81 MG tablet Take 81 mg by mouth daily.   Yes [provider]  butalbital-acetaminophen-caffeine (FIORICET, ESGIC) 50-325-40 MG tablet Take for HA abortion Q12h PRN. No more than 2 in a day and no more than 5 a week. Patient taking differently: Take 1 tablet by mouth every 12 (twelve) hours as needed. Take 1 tablet by mouth every 12 hours as needed "to abort headache"/MAX 2 IN 24 HOURS OR 5 TABLETS A WEEK 12/04/17  Yes Rosalin Hawking, MD  colestipol (COLESTID) 1 g tablet Take 1 g by mouth 2 (two) times daily. 01/18/17  Yes [provider]  CRANBERRY PO  Take 1,500 mg by mouth 2 (two) times daily.    Yes [provider]  cyanocobalamin (,VITAMIN B-12,) 1000 MCG/ML injection Inject 1,000 mcg into the muscle every 30 (thirty) days.  01/18/17  Yes [provider]  donepezil (ARICEPT) 10 MG tablet Take one tablet daily in the morning with breakfast in one month after finishing the 5mg  tablets Patient taking differently: Take 10 mg by mouth daily.  01/03/18  Yes Rosalin Hawking, MD  DULoxetine (CYMBALTA) 30 MG capsule Take 30 mg by mouth at bedtime. 01/19/18  Yes [provider]  fluticasone (FLONASE) 50 MCG/ACT nasal spray Place 1-2 sprays into both nostrils daily as needed for allergies or rhinitis.  12/20/17  Yes [provider]    levETIRAcetam (KEPPRA) 500 MG tablet Take 1 tablet (500 mg total) by mouth 2 (two) times daily. 12/25/17  Yes Rosalin Hawking, MD  loratadine (CLARITIN) 10 MG tablet Take 10 mg by mouth daily.   Yes [provider]  meloxicam (MOBIC) 15 MG tablet Take 15 mg by mouth daily. 01/12/18  Yes [provider]  metFORMIN (GLUCOPHAGE) 850 MG tablet Take 850 mg by mouth 2 (two) times daily. 01/18/17  Yes [provider]  Multiple Vitamins-Minerals (MULTIVITAMIN ADULT) CHEW Chew 2 tablets by mouth 3 (three) times a week.    Yes [provider]  ondansetron (ZOFRAN-ODT) 4 MG disintegrating tablet Take 4 mg by mouth 2 (two) times daily as needed for nausea/vomiting. 11/27/17  Yes [provider]  Vitamin D, Ergocalciferol, (DRISDOL) 50000 units CAPS capsule Take 50,000 Units by mouth See admin instructions. Take 50,000 units by mouth 2 times a week on Monday and Thursday 01/18/17  Yes [provider]  donepezil (ARICEPT) 5 MG tablet One table daily for one month, take in the morning with breakfast. Patient not taking: Reported on 02/06/2018 12/04/17   Rosalin Hawking, MD    Family History Family History  Problem Relation Age of Onset  . Transient ischemic attack Mother   . Alzheimer's disease Mother   . Cancer Mother   . Heart disease Father   . Stroke Maternal Grandmother   . Migraines Daughter     Social History Social History   Tobacco Use  . Smoking status: Never Smoker  . Smokeless tobacco: Never Used  Substance Use Topics  . Alcohol use: No  . Drug use: No     Allergies   Erythromycin base; Flounder [fish allergy]; Shellfish-derived products; and Penicillins   Review of Systems Review of Systems  Constitutional: Negative for appetite change.  HENT: Negative for congestion.   Respiratory: Negative for shortness of breath.   Cardiovascular: Negative for chest pain.  Gastrointestinal: Negative for abdominal pain.  Genitourinary: Negative for  flank pain.  Musculoskeletal: Positive for neck pain. Negative for back pain.  Skin: Negative for rash.  Neurological: Positive for speech difficulty, weakness and headaches.  Psychiatric/Behavioral: Negative for confusion.     Physical Exam Updated Vital Signs BP (!) 154/85   Pulse 76   Temp 97.8 F (36.6 C)   Resp 18   Ht 5\' 6"  (1.676 m)   Wt 74.8 kg (165 lb)   SpO2 95%   BMI 26.63 kg/m   Physical Exam  Constitutional: She is oriented to person, place, and time. She appears well-developed.  HENT:  Slight left temporal tenderness.  No deformity.  Eyes: EOM are normal.  Neck: Neck supple.  Mild upper cervical spine tenderness.  Cardiovascular: Normal rate.  Pulmonary/Chest: Effort normal.  Abdominal: Soft.  Musculoskeletal: She exhibits no tenderness.  Neurological: She is alert and oriented to person, place, and time.  Patient awake and appropriate.  Slight left facial droop.  Slight decreased strength on left side compared to right.  Speech is normal.  Patient able to recognize her daughter.  Skin: Skin is dry. Capillary refill takes less than 2 seconds.  Psychiatric: She has a normal mood and affect.     ED Treatments / Results  Labs (all labs ordered are listed, but only abnormal results are displayed) Labs Reviewed  COMPREHENSIVE METABOLIC PANEL - Abnormal; Notable for the following components:      Result Value   Glucose, Bld 119 (*)    Creatinine, Ser 1.02 (*)    GFR calc non Af Amer 55 (*)    All other components within normal limits  CBG MONITORING, ED - Abnormal; Notable for the following components:   Glucose-Capillary 106 (*)    All other components within normal limits  I-STAT CHEM 8, ED - Abnormal; Notable for the following components:   Chloride 100 (*)    BUN 22 (*)    Glucose, Bld 114 (*)    All other components within normal limits  PROTIME-INR  APTT  CBC  DIFFERENTIAL  I-STAT TROPONIN, ED    EKG EKG  Interpretation  Date/Time:  Tuesday February 06 2018 11:50:39 EDT Ventricular Rate:  79 PR Interval:    QRS Duration: 87 QT Interval:  383 QTC Calculation: 439 R Axis:   -29 Text Interpretation:  Sinus rhythm Multiple premature complexes, vent & supraven Abnormal R-wave progression, late transition Left ventricular hypertrophy Confirmed by Davonna Belling [phone]) on 02/06/2018 12:03:56 PM   Radiology Ct Head Wo Contrast  Result Date: 02/06/2018 CLINICAL DATA:  [place] in the shower twice today, with trauma to the back of the head. EXAM: CT HEAD WITHOUT CONTRAST CT CERVICAL SPINE WITHOUT CONTRAST TECHNIQUE: Multidetector CT imaging of the head and cervical spine was performed following the standard protocol without intravenous contrast. Multiplanar CT image reconstructions of the cervical spine were also generated. COMPARISON:  11/30/2017.  11/28/2017. FINDINGS: CT HEAD FINDINGS Brain: No sign of acute infarction. Chronic ischemic changes affecting the left basal ganglia and hemispheric deep white matter left worse than right. No sign of acute infarction, mass lesion, hemorrhage, hydrocephalus or extra-axial collection. Vascular: There is atherosclerotic calcification of the major vessels at the base of the brain. Skull: No skull fracture. Sinuses/Orbits: Clear/normal Other: None CT CERVICAL SPINE FINDINGS Alignment: Normal Skull base and vertebrae: Chronic fusion of the posterior elements on the right at C3-4. No sign of fracture. Soft tissues and spinal canal: Negative Disc levels: Facet osteoarthritis on the right at C2-3, C4-5 and C7-T1. Fusion on the right at C3-4 as noted above. Facet osteoarthritis on the left at C3-4 and C7-T1. Upper chest: Scarring in both upper lungs. This is probably unchanged, but is not completely evaluated. Other: None IMPRESSION: Head CT: No acute or traumatic finding. Old infarction left basal ganglia region and chronic small-vessel change of the white matter. Cervical spine  CT: No acute or traumatic finding. Degenerative changes as above without central canal stenosis. Electronically Signed   By: Nelson Chimes M.D.   On: 02/06/2018 13:09   Ct Cervical Spine Wo Contrast  Result Date: 02/06/2018 CLINICAL DATA:  [place] in the shower twice today, with trauma to the back of the head. EXAM: CT HEAD WITHOUT CONTRAST CT CERVICAL SPINE WITHOUT CONTRAST TECHNIQUE: Multidetector CT imaging of the  head and cervical spine was performed following the standard protocol without intravenous contrast. Multiplanar CT image reconstructions of the cervical spine were also generated. COMPARISON:  11/30/2017.  11/28/2017. FINDINGS: CT HEAD FINDINGS Brain: No sign of acute infarction. Chronic ischemic changes affecting the left basal ganglia and hemispheric deep white matter left worse than right. No sign of acute infarction, mass lesion, hemorrhage, hydrocephalus or extra-axial collection. Vascular: There is atherosclerotic calcification of the major vessels at the base of the brain. Skull: No skull fracture. Sinuses/Orbits: Clear/normal Other: None CT CERVICAL SPINE FINDINGS Alignment: Normal Skull base and vertebrae: Chronic fusion of the posterior elements on the right at C3-4. No sign of fracture. Soft tissues and spinal canal: Negative Disc levels: Facet osteoarthritis on the right at C2-3, C4-5 and C7-T1. Fusion on the right at C3-4 as noted above. Facet osteoarthritis on the left at C3-4 and C7-T1. Upper chest: Scarring in both upper lungs. This is probably unchanged, but is not completely evaluated. Other: None IMPRESSION: Head CT: No acute or traumatic finding. Old infarction left basal ganglia region and chronic small-vessel change of the white matter. Cervical spine CT: No acute or traumatic finding. Degenerative changes as above without central canal stenosis. Electronically Signed   By: Nelson Chimes M.D.   On: 02/06/2018 13:09    Procedures Procedures (including critical care  time)  Medications Ordered in ED Medications  promethazine (PHENERGAN) injection 12.5 mg (12.5 mg Intravenous Given 02/06/18 1215)  promethazine (PHENERGAN) injection 12.5 mg (12.5 mg Intravenous Given 02/06/18 1338)  diphenhydrAMINE (BENADRYL) injection 12.5 mg (12.5 mg Intravenous Given 02/06/18 1338)  ketorolac (TORADOL) 30 MG/ML injection 15 mg (15 mg Intravenous Given 02/06/18 1338)     Initial Impression / Assessment and Plan / ED Course  I have reviewed the triage vital signs and the nursing notes.  Pertinent labs & imaging results that were available during my care of the patient were reviewed by me and considered in my medical decision making (see chart for details).     Patient brought in with fall.  Imaging reassuring.  Has had complicated migraines with weakness in the past.  Also difficulty speaking with it.  Initial slight facial droop is resolved with treatment.  Back to baseline.  Feels much better headache is resolved.  Likely a recurrent complicated migraine.  Will discharge home to follow-up with PCP as needed.  Patient had had some difficulty ambulating.  Did not really lateralize to left or right.  Patient is somewhat sleepy.  Likely due to some of the medication she had been given to help with her migraine.  Discussed with patient and daughter.  Patient and daughter feels if she can go home.  Likely just recovering from migraine.  Stroke felt less likely.  Will discharge home  Final Clinical Impressions(s) / ED Diagnoses   Final diagnoses:  Complicated migraine    ED Discharge Orders    None       Davonna Belling, MD 02/06/18 1648    Davonna Belling, MD 02/06/18 [phone]

## 2018-02-06 NOTE — ED Notes (Signed)
Ambulated the pt with the assistance of [place], EMT.  Pt required moderate assistance to stand, and begin ambulating.  During ambulation the pt was leaning to the left and requiring significant assistance to stay upright.  After walking approximately 10 feet and turning around the pt required less assistance returning to the room, with her gait and speed improving slightly. Pt denies any lightheadedness or dizziness.

## 2018-02-07 DIAGNOSIS — R4189 Other symptoms and signs involving cognitive functions and awareness: Secondary | ICD-10-CM | POA: Diagnosis not present

## 2018-02-07 DIAGNOSIS — G43109 Migraine with aura, not intractable, without status migrainosus: Secondary | ICD-10-CM | POA: Diagnosis not present

## 2018-02-07 DIAGNOSIS — R82998 Other abnormal findings in urine: Secondary | ICD-10-CM | POA: Diagnosis not present

## 2018-02-07 DIAGNOSIS — R4182 Altered mental status, unspecified: Secondary | ICD-10-CM | POA: Diagnosis not present

## 2018-02-17 DIAGNOSIS — I10 Essential (primary) hypertension: Secondary | ICD-10-CM | POA: Diagnosis not present

## 2018-02-17 DIAGNOSIS — E871 Hypo-osmolality and hyponatremia: Secondary | ICD-10-CM | POA: Diagnosis not present

## 2018-02-17 DIAGNOSIS — E119 Type 2 diabetes mellitus without complications: Secondary | ICD-10-CM | POA: Diagnosis not present

## 2018-02-17 DIAGNOSIS — R4182 Altered mental status, unspecified: Secondary | ICD-10-CM | POA: Diagnosis not present

## 2018-02-17 DIAGNOSIS — F329 Major depressive disorder, single episode, unspecified: Secondary | ICD-10-CM | POA: Diagnosis not present

## 2018-02-17 DIAGNOSIS — R531 Weakness: Secondary | ICD-10-CM | POA: Diagnosis not present

## 2018-02-17 DIAGNOSIS — J45909 Unspecified asthma, uncomplicated: Secondary | ICD-10-CM | POA: Diagnosis not present

## 2018-02-17 DIAGNOSIS — R4701 Aphasia: Secondary | ICD-10-CM | POA: Diagnosis not present

## 2018-02-17 DIAGNOSIS — R1312 Dysphagia, oropharyngeal phase: Secondary | ICD-10-CM | POA: Diagnosis not present

## 2018-02-17 DIAGNOSIS — G8190 Hemiplegia, unspecified affecting unspecified side: Secondary | ICD-10-CM | POA: Diagnosis not present

## 2018-02-17 DIAGNOSIS — R918 Other nonspecific abnormal finding of lung field: Secondary | ICD-10-CM | POA: Diagnosis not present

## 2018-02-17 DIAGNOSIS — G40309 Generalized idiopathic epilepsy and epileptic syndromes, not intractable, without status epilepticus: Secondary | ICD-10-CM | POA: Diagnosis not present

## 2018-02-17 DIAGNOSIS — G43409 Hemiplegic migraine, not intractable, without status migrainosus: Secondary | ICD-10-CM | POA: Diagnosis not present

## 2018-02-17 DIAGNOSIS — E7800 Pure hypercholesterolemia, unspecified: Secondary | ICD-10-CM | POA: Diagnosis not present

## 2018-02-17 DIAGNOSIS — E785 Hyperlipidemia, unspecified: Secondary | ICD-10-CM | POA: Diagnosis not present

## 2018-02-17 DIAGNOSIS — Z7984 Long term (current) use of oral hypoglycemic drugs: Secondary | ICD-10-CM | POA: Diagnosis not present

## 2018-02-17 DIAGNOSIS — R4689 Other symptoms and signs involving appearance and behavior: Secondary | ICD-10-CM | POA: Diagnosis not present

## 2018-02-17 DIAGNOSIS — G9340 Encephalopathy, unspecified: Secondary | ICD-10-CM | POA: Diagnosis not present

## 2018-02-21 DIAGNOSIS — E559 Vitamin D deficiency, unspecified: Secondary | ICD-10-CM | POA: Diagnosis not present

## 2018-02-21 DIAGNOSIS — R12 Heartburn: Secondary | ICD-10-CM | POA: Diagnosis not present

## 2018-02-21 DIAGNOSIS — E538 Deficiency of other specified B group vitamins: Secondary | ICD-10-CM | POA: Diagnosis not present

## 2018-02-21 DIAGNOSIS — M159 Polyosteoarthritis, unspecified: Secondary | ICD-10-CM | POA: Diagnosis not present

## 2018-02-21 DIAGNOSIS — E1165 Type 2 diabetes mellitus with hyperglycemia: Secondary | ICD-10-CM | POA: Diagnosis not present

## 2018-02-27 ENCOUNTER — Other Ambulatory Visit: Payer: Self-pay

## 2018-02-27 DIAGNOSIS — G43009 Migraine without aura, not intractable, without status migrainosus: Secondary | ICD-10-CM | POA: Diagnosis not present

## 2018-02-27 DIAGNOSIS — R51 Headache: Secondary | ICD-10-CM | POA: Diagnosis not present

## 2018-02-27 NOTE — Patient Outreach (Signed)
[facility]) Care Management  02/27/2018  Bonnie Cook [birth year] [id number]   Referral Date: 02/27/18 Referral Source: HTA Date of Admission: Unknown Diagnosis: unknown Date of Discharge: 02/20/18 Facility:  [facility]:  [facility]  Referral received. No outreach warranted at this time. Transition of Care  will be completed by primary care provider office who will refer to [facility] care management if needed.  Plan: RN CM will close case.     Jone Baseman, RN, MSN [facility] Care Management Care Management Coordinator Direct Line [phone] Toll Free: [phone]  Fax: [phone]

## 2018-03-12 ENCOUNTER — Ambulatory Visit: Payer: PPO | Admitting: Neurology

## 2018-03-23 DIAGNOSIS — G3184 Mild cognitive impairment, so stated: Secondary | ICD-10-CM | POA: Diagnosis not present

## 2018-03-23 DIAGNOSIS — R51 Headache: Secondary | ICD-10-CM | POA: Diagnosis not present

## 2018-03-23 DIAGNOSIS — F0630 Mood disorder due to known physiological condition, unspecified: Secondary | ICD-10-CM | POA: Diagnosis not present

## 2018-03-30 DIAGNOSIS — R300 Dysuria: Secondary | ICD-10-CM | POA: Diagnosis not present

## 2018-03-30 DIAGNOSIS — R790 Abnormal level of blood mineral: Secondary | ICD-10-CM | POA: Diagnosis not present

## 2018-04-06 DIAGNOSIS — G40909 Epilepsy, unspecified, not intractable, without status epilepticus: Secondary | ICD-10-CM | POA: Diagnosis not present

## 2018-04-06 DIAGNOSIS — E871 Hypo-osmolality and hyponatremia: Secondary | ICD-10-CM | POA: Diagnosis not present

## 2018-04-06 DIAGNOSIS — Z8669 Personal history of other diseases of the nervous system and sense organs: Secondary | ICD-10-CM | POA: Diagnosis not present

## 2018-04-06 DIAGNOSIS — G9340 Encephalopathy, unspecified: Secondary | ICD-10-CM | POA: Diagnosis not present

## 2018-04-08 DIAGNOSIS — G9340 Encephalopathy, unspecified: Secondary | ICD-10-CM | POA: Diagnosis not present

## 2018-04-08 DIAGNOSIS — I517 Cardiomegaly: Secondary | ICD-10-CM | POA: Diagnosis not present

## 2018-04-08 DIAGNOSIS — R4701 Aphasia: Secondary | ICD-10-CM | POA: Diagnosis not present

## 2018-04-08 DIAGNOSIS — Z7982 Long term (current) use of aspirin: Secondary | ICD-10-CM | POA: Diagnosis not present

## 2018-04-08 DIAGNOSIS — Z5181 Encounter for therapeutic drug level monitoring: Secondary | ICD-10-CM | POA: Diagnosis not present

## 2018-04-08 DIAGNOSIS — R9431 Abnormal electrocardiogram [ECG] [EKG]: Secondary | ICD-10-CM | POA: Diagnosis not present

## 2018-04-25 DIAGNOSIS — I10 Essential (primary) hypertension: Secondary | ICD-10-CM | POA: Diagnosis not present

## 2018-04-30 ENCOUNTER — Encounter: Payer: PPO | Admitting: Psychology

## 2018-05-09 DIAGNOSIS — S60222A Contusion of left hand, initial encounter: Secondary | ICD-10-CM | POA: Diagnosis not present

## 2018-05-09 DIAGNOSIS — N390 Urinary tract infection, site not specified: Secondary | ICD-10-CM | POA: Diagnosis not present

## 2018-05-15 DIAGNOSIS — R4182 Altered mental status, unspecified: Secondary | ICD-10-CM | POA: Diagnosis not present

## 2018-05-15 DIAGNOSIS — N3000 Acute cystitis without hematuria: Secondary | ICD-10-CM | POA: Diagnosis not present

## 2018-05-28 ENCOUNTER — Encounter: Payer: PPO | Admitting: Psychology

## 2018-05-29 DIAGNOSIS — R569 Unspecified convulsions: Secondary | ICD-10-CM | POA: Diagnosis not present

## 2018-05-29 DIAGNOSIS — E871 Hypo-osmolality and hyponatremia: Secondary | ICD-10-CM | POA: Diagnosis not present

## 2018-05-29 DIAGNOSIS — Z8669 Personal history of other diseases of the nervous system and sense organs: Secondary | ICD-10-CM | POA: Diagnosis not present

## 2018-05-29 DIAGNOSIS — G9340 Encephalopathy, unspecified: Secondary | ICD-10-CM | POA: Diagnosis not present

## 2018-05-30 DIAGNOSIS — Z8669 Personal history of other diseases of the nervous system and sense organs: Secondary | ICD-10-CM | POA: Diagnosis not present

## 2018-05-30 DIAGNOSIS — G9340 Encephalopathy, unspecified: Secondary | ICD-10-CM | POA: Diagnosis not present

## 2018-05-30 DIAGNOSIS — R569 Unspecified convulsions: Secondary | ICD-10-CM | POA: Diagnosis not present

## 2018-05-30 DIAGNOSIS — R51 Headache: Secondary | ICD-10-CM | POA: Diagnosis not present

## 2018-05-30 DIAGNOSIS — R4701 Aphasia: Secondary | ICD-10-CM | POA: Diagnosis not present

## 2018-05-30 DIAGNOSIS — Z8673 Personal history of transient ischemic attack (TIA), and cerebral infarction without residual deficits: Secondary | ICD-10-CM | POA: Diagnosis not present

## 2018-05-30 DIAGNOSIS — G9349 Other encephalopathy: Secondary | ICD-10-CM | POA: Diagnosis not present

## 2018-05-30 DIAGNOSIS — E871 Hypo-osmolality and hyponatremia: Secondary | ICD-10-CM | POA: Diagnosis not present

## 2018-05-31 DIAGNOSIS — R569 Unspecified convulsions: Secondary | ICD-10-CM | POA: Diagnosis not present

## 2018-05-31 DIAGNOSIS — E871 Hypo-osmolality and hyponatremia: Secondary | ICD-10-CM | POA: Diagnosis not present

## 2018-05-31 DIAGNOSIS — G9340 Encephalopathy, unspecified: Secondary | ICD-10-CM | POA: Diagnosis not present

## 2018-05-31 DIAGNOSIS — Z8669 Personal history of other diseases of the nervous system and sense organs: Secondary | ICD-10-CM | POA: Diagnosis not present

## 2018-06-10 DIAGNOSIS — N390 Urinary tract infection, site not specified: Secondary | ICD-10-CM | POA: Diagnosis not present

## 2018-06-10 DIAGNOSIS — R300 Dysuria: Secondary | ICD-10-CM | POA: Diagnosis not present

## 2018-06-15 DIAGNOSIS — E559 Vitamin D deficiency, unspecified: Secondary | ICD-10-CM | POA: Diagnosis not present

## 2018-06-15 DIAGNOSIS — E538 Deficiency of other specified B group vitamins: Secondary | ICD-10-CM | POA: Diagnosis not present

## 2018-06-29 DIAGNOSIS — I6381 Other cerebral infarction due to occlusion or stenosis of small artery: Secondary | ICD-10-CM | POA: Diagnosis not present

## 2018-06-29 DIAGNOSIS — R569 Unspecified convulsions: Secondary | ICD-10-CM | POA: Diagnosis not present

## 2018-06-29 DIAGNOSIS — G9340 Encephalopathy, unspecified: Secondary | ICD-10-CM | POA: Diagnosis not present

## 2018-07-02 DIAGNOSIS — R350 Frequency of micturition: Secondary | ICD-10-CM | POA: Diagnosis not present

## 2018-08-02 DIAGNOSIS — R5381 Other malaise: Secondary | ICD-10-CM | POA: Diagnosis not present

## 2018-08-02 DIAGNOSIS — R5383 Other fatigue: Secondary | ICD-10-CM | POA: Diagnosis not present

## 2018-08-02 DIAGNOSIS — N390 Urinary tract infection, site not specified: Secondary | ICD-10-CM | POA: Diagnosis not present

## 2018-08-16 DIAGNOSIS — D329 Benign neoplasm of meninges, unspecified: Secondary | ICD-10-CM | POA: Diagnosis not present

## 2018-08-23 DIAGNOSIS — E871 Hypo-osmolality and hyponatremia: Secondary | ICD-10-CM | POA: Diagnosis not present

## 2018-08-23 DIAGNOSIS — Z23 Encounter for immunization: Secondary | ICD-10-CM | POA: Diagnosis not present

## 2018-09-06 DIAGNOSIS — R2689 Other abnormalities of gait and mobility: Secondary | ICD-10-CM | POA: Diagnosis not present

## 2018-09-06 DIAGNOSIS — G9340 Encephalopathy, unspecified: Secondary | ICD-10-CM | POA: Diagnosis not present

## 2018-09-06 DIAGNOSIS — R569 Unspecified convulsions: Secondary | ICD-10-CM | POA: Diagnosis not present

## 2018-09-06 DIAGNOSIS — G20 Parkinson's disease: Secondary | ICD-10-CM | POA: Diagnosis not present

## 2018-09-10 DIAGNOSIS — Z5181 Encounter for therapeutic drug level monitoring: Secondary | ICD-10-CM | POA: Diagnosis not present

## 2018-09-10 DIAGNOSIS — R4182 Altered mental status, unspecified: Secondary | ICD-10-CM | POA: Diagnosis not present

## 2018-09-10 DIAGNOSIS — L680 Hirsutism: Secondary | ICD-10-CM | POA: Diagnosis not present

## 2018-09-10 DIAGNOSIS — N3001 Acute cystitis with hematuria: Secondary | ICD-10-CM | POA: Diagnosis not present

## 2018-09-10 DIAGNOSIS — Z79899 Other long term (current) drug therapy: Secondary | ICD-10-CM | POA: Diagnosis not present

## 2018-09-27 DIAGNOSIS — R197 Diarrhea, unspecified: Secondary | ICD-10-CM | POA: Diagnosis not present

## 2018-09-27 DIAGNOSIS — L680 Hirsutism: Secondary | ICD-10-CM | POA: Diagnosis not present

## 2018-09-27 DIAGNOSIS — R300 Dysuria: Secondary | ICD-10-CM | POA: Diagnosis not present

## 2018-09-28 DIAGNOSIS — Z9181 History of falling: Secondary | ICD-10-CM | POA: Diagnosis not present

## 2018-09-28 DIAGNOSIS — R269 Unspecified abnormalities of gait and mobility: Secondary | ICD-10-CM | POA: Diagnosis not present

## 2018-09-28 DIAGNOSIS — K210 Gastro-esophageal reflux disease with esophagitis: Secondary | ICD-10-CM | POA: Diagnosis not present

## 2018-09-28 DIAGNOSIS — G3183 Dementia with Lewy bodies: Secondary | ICD-10-CM | POA: Diagnosis not present

## 2018-09-28 DIAGNOSIS — F0281 Dementia in other diseases classified elsewhere with behavioral disturbance: Secondary | ICD-10-CM | POA: Diagnosis not present

## 2018-10-10 DIAGNOSIS — A09 Infectious gastroenteritis and colitis, unspecified: Secondary | ICD-10-CM | POA: Diagnosis not present

## 2018-10-24 ENCOUNTER — Encounter: Payer: Self-pay | Admitting: Gastroenterology

## 2018-10-31 ENCOUNTER — Ambulatory Visit (INDEPENDENT_AMBULATORY_CARE_PROVIDER_SITE_OTHER): Payer: PPO | Admitting: Gastroenterology

## 2018-10-31 ENCOUNTER — Encounter: Payer: Self-pay | Admitting: Gastroenterology

## 2018-10-31 VITALS — BP 154/90 | HR 83 | Ht 62.0 in | Wt 155.4 lb

## 2018-10-31 DIAGNOSIS — R197 Diarrhea, unspecified: Secondary | ICD-10-CM

## 2018-10-31 MED ORDER — DIPHENOXYLATE-ATROPINE 2.5-0.025 MG PO TABS: 1.0000 | ORAL_TABLET | Freq: Three times a day (TID) | ORAL | 0 refills | Status: DC | PRN

## 2018-10-31 NOTE — Progress Notes (Signed)
Chief Complaint: Diarrhea  Referring Provider:  Myrlene Broker, MD      ASSESSMENT AND PLAN;   #1.  Chronic diarrhea.  Negative stool studies except for WBCs.  Patient on magnesium supplements but cannot be taken off.  Has been on metformin, Nexium, Aricept and meloxicam. Nl TSH  Plan: - Restart calcium 500 mg/tab. 1 tablet p.o. twice daily. - Lomotil 1 tablet p.o. 3 times daily as needed. - Continue magnesium for now. - Continue Colestid 1 g p.o. twice daily for now. - Proceed with colonoscopy with biopsies to r/o  microscopic colitis with miralax prep.  I have discussed the risks and benefits with the patient and patient's daughter Investment banker, corporate at [facility]).  -Track frequency of diarrhea and weight.  Patient's daughter to keep a record. -If continued diarrhea in 2 weeks, change Nexium to Protonix 20 mg p.o. once a day, stop meloxicam.  Consider backing off on metformin (hemoglobin A1c was 5.9 on 02/2018).  HPI:    Bonnie Cook is a 67 y.o. female  Diarrhea x 6 weeks Blow out 2-3 times per day especially on a bad day.  Previously has been having bowel movements at the frequency of 1 to 2/day normal in consistency. Watery, without any abdominal pain but does have abdominal bloating No blood or significant mucus.  However, it does smell bad. No nocturnal symptoms No weight loss No significant upper GI symptoms currently.  No nausea, vomiting, heartburn, odynophagia or dysphagia.  Has been on magnesium supplements since her magnesium level drops down.  No excessive use of sodas chocolates mints or chewing gums.  No recent travel.  No recent antibiotics.  Stool cultures have been negative.  Diagnosed with mild dementia and is currently taking Aricept  Past GI procedures -Colonoscopy by Dr. Melina Copa 2014?  Additional social history-daughter is Therapist, sports at [employer]. Past Medical History:  Diagnosis Date  . Arthritis    "hands, shoulders" (11/29/2017)  .  Asthma   . Basal cell carcinoma    "burned off her face" (11/29/2017)  . Depression   . GERD (gastroesophageal reflux disease)   . History of kidney stones 1970s?  Marland Kitchen Hypercholesteremia   . Hypertension   . Lumbar spinal stenosis   . Meningioma ([place])    dx 2019  . Migraine    "daily in the last week; usually < monthly" (11/29/2017)  . Seizures ([place])    "EEG showed seizure activity today; 2 previous EEGs in the past year didn't show any" (11/29/2017)  . Stroke [facility])    "saw evidence of an old old stroke 01/2017, 10/2017, 11/2017/CT" (11/29/2017)  . Type II diabetes mellitus ([place])     Past Surgical History:  Procedure Laterality Date  . BACK SURGERY  1998  . COLONOSCOPY     Maybe 2014  . FINGER FRACTURE SURGERY Right 2014   "broke her little finger; put pins in it"  . FRACTURE SURGERY    . LUMBAR DISC SURGERY  ~ 1998   "ruptured discs"    Family History  Problem Relation Age of Onset  . Transient ischemic attack Mother   . Alzheimer's disease Mother   . Cancer Mother   . Breast cancer Mother   . Heart disease Mother   . Heart disease Father   . Colonic polyp Father   . Stroke Maternal Grandmother   . Diabetes Maternal Grandmother   . Migraines Daughter   . Colon cancer Neg Hx   . Esophageal  cancer Neg Hx     Social History   Tobacco Use  . Smoking status: Never Smoker  . Smokeless tobacco: Never Used  Substance Use Topics  . Alcohol use: No  . Drug use: No    Current Outpatient Medications  Medication Sig Dispense Refill  . albuterol (PROAIR HFA) 108 (90 Base) MCG/ACT inhaler Inhale 1-2 puffs into the lungs every 6 hours as needed for shortness of breath or wheezing    . aspirin EC 81 MG tablet Take 81 mg by mouth daily.    . butalbital-acetaminophen-caffeine (FIORICET, ESGIC) 50-325-40 MG tablet Take for HA abortion Q12h PRN. No more than 2 in a day and no more than 5 a week. (Patient taking differently: Take 1 tablet by mouth every 12 (twelve) hours as needed.  Take 1 tablet by mouth every 12 hours as needed "to abort headache"/MAX 2 IN 24 HOURS OR 5 TABLETS A WEEK) 30 tablet 1  . colestipol (COLESTID) 1 g tablet Take 1 g by mouth 2 (two) times daily.    Marland Kitchen CRANBERRY PO Take 15,000 mg by mouth 2 (two) times daily.     . cyanocobalamin (,VITAMIN B-12,) 1000 MCG/ML injection Inject 1,000 mcg into the muscle every 30 (thirty) days.   1  . donepezil (ARICEPT) 5 MG tablet One table daily for one month, take in the morning with breakfast. 30 tablet 0  . DULoxetine (CYMBALTA) 30 MG capsule Take 30 mg by mouth at bedtime.    . Esomeprazole Magnesium (NEXIUM PO) Take 20 mg by mouth at bedtime.     . fluticasone (FLONASE) 50 MCG/ACT nasal spray Place 1-2 sprays into both nostrils daily as needed for allergies or rhinitis.     Marland Kitchen lamoTRIgine (LAMICTAL PO) Take 50 mg by mouth 2 (two) times daily.    . Loperamide HCl (IMODIUM PO) Take 1 tablet by mouth as needed.    . loratadine (CLARITIN) 10 MG tablet Take 10 mg by mouth daily.    Marland Kitchen MAGNESIUM CITRATE PO Take 4,000 mg by mouth daily.    . meloxicam (MOBIC) 15 MG tablet Take 15 mg by mouth daily.    . metFORMIN (GLUCOPHAGE) 850 MG tablet Take 850 mg by mouth 2 (two) times daily.    . Multiple Vitamins-Minerals (MULTIVITAMIN ADULT) CHEW Chew 2 tablets by mouth 3 (three) times a week.     . ondansetron (ZOFRAN-ODT) 4 MG disintegrating tablet Take 4 mg by mouth 2 (two) times daily as needed for nausea/vomiting.    . Vitamin D, Ergocalciferol, (DRISDOL) 50000 units CAPS capsule Take 50,000 Units by mouth See admin instructions. Take 50,000 units by mouth 1 time a week on Monday  3  . donepezil (ARICEPT) 10 MG tablet Take one tablet daily in the morning with breakfast in one month after finishing the 5mg  tablets (Patient not taking: Reported on 10/31/2018) 30 tablet 4   No current facility-administered medications for this visit.     Allergies  Allergen Reactions  . Erythromycin Base Nausea And Vomiting and Other (See  Comments)    GI Upset  . Flounder [Fish Allergy] Nausea Only  . Shellfish-Derived Products Nausea Only  . Penicillins Rash    From childhood: Has patient had a PCN reaction causing immediate rash, facial/tongue/throat swelling, SOB or lightheadedness with hypotension: Yes Has patient had a PCN reaction causing severe rash involving mucus membranes or skin necrosis: Unk Has patient had a PCN reaction that required hospitalization: Unk Has patient had a PCN reaction occurring  within the last 10 years: No If all of the above answers are "NO", then may proceed with Cephalosporin use.      Review of Systems:  Constitutional: Denies fever, chills, diaphoresis, appetite change and fatigue.  HEENT: Denies photophobia, eye pain, redness, hearing loss, ear pain, congestion, sore throat, rhinorrhea, sneezing, mouth sores, neck pain, neck stiffness and tinnitus.   Respiratory: Denies SOB, DOE, cough, chest tightness,  and wheezing.   Cardiovascular: Denies chest pain, palpitations and leg swelling.  Genitourinary: Denies dysuria, urgency, frequency, hematuria, flank pain and difficulty urinating.  Musculoskeletal: Denies myalgias, back pain, joint swelling, arthralgias and gait problem.  Skin: No rash.  Neurological: Denies dizziness, seizures, syncope, weakness, light-headedness, numbness and headaches.  Hematological: Denies adenopathy. Easy bruising, personal or family bleeding history  Psychiatric/Behavioral: No anxiety or depression.  Has dementia     Physical Exam:    BP (!) 154/90   Pulse 83   Ht 5\' 2"  (1.575 m)   Wt 155 lb 6 oz (70.5 kg)   BMI 28.42 kg/m  Filed Weights   10/31/18 0843  Weight: 155 lb 6 oz (70.5 kg)   Constitutional:  Well-developed, in no acute distress. Psychiatric: Normal mood and affect. Behavior is normal. HEENT: Pupils normal.  Conjunctivae are normal. No scleral icterus.  Cardiovascular: Normal rate, regular rhythm. No edema Pulmonary/chest: Effort  normal and breath sounds normal. No wheezing, rales or rhonchi. Abdominal: Soft, nondistended. Nontender. Bowel sounds active throughout. There are no masses palpable. No hepatomegaly. Rectal:  defered Neurological: Alert and oriented to person place and time. Skin: Skin is warm and dry. No rashes noted.  Data Reviewed: I have personally reviewed following labs and imaging studies  CBC: CBC Latest Ref Rng & Units 02/06/2018 02/06/2018 12/02/2017  WBC 4.0 - 10.5 K/uL - 8.3 9.3  Hemoglobin 12.0 - 15.0 g/dL 15.0 14.6 13.8  Hematocrit 36.0 - 46.0 % 44.0 43.0 40.0  Platelets 150 - 400 K/uL - 280 366    CMP: CMP Latest Ref Rng & Units 02/06/2018 02/06/2018 12/04/2017  Glucose 65 - 99 mg/dL 114(H) 119(H) 102(H)  BUN 6 - 20 mg/dL 22(H) 18 33(H)  Creatinine 0.44 - 1.00 mg/dL 0.90 1.02(H) 1.00  Sodium 135 - 145 mmol/L 137 138 138  Potassium 3.5 - 5.1 mmol/L 4.0 4.1 5.0  Chloride 101 - 111 mmol/L 100(L) 101 99  CO2 22 - 32 mmol/L - 26 25  Calcium 8.9 - 10.3 mg/dL - 9.4 10.1  Total Protein 6.5 - 8.1 g/dL - 7.0 -  Total Bilirubin 0.3 - 1.2 mg/dL - 0.6 -  Alkaline Phos 38 - 126 U/L - 63 -  AST 15 - 41 U/L - 28 -  ALT 14 - 54 U/L - 18 -     Carmell Austria, MD 10/31/2018, 9:08 AM  Cc: Myrlene Broker, MD

## 2018-10-31 NOTE — Patient Instructions (Addendum)
If you are age 69 or older, your body mass index should be between 23-30. Your Body mass index is 28.42 kg/m. If this is out of the aforementioned range listed, please consider follow up with your Primary Care Provider.  If you are age 39 or younger, your body mass index should be between 19-25. Your Body mass index is 28.42 kg/m. If this is out of the aformentioned range listed, please consider follow up with your Primary Care Provider.   You have been scheduled for a colonoscopy. Please follow written instructions given to you at your visit today.  Please pick up your prep supplies at the pharmacy within the next 1-3 days. If you use inhalers (even only as needed), please bring them with you on the day of your procedure. Your physician has requested that you go to www.startemmi.com and enter the access code given to you at your visit today. This web site gives a general overview about your procedure. However, you should still follow specific instructions given to you by our office regarding your preparation for the procedure.  Please purchase the following medications over the counter and take as directed: Calcium twice daily  We have sent the following medications to your pharmacy for you to pick up at your convenience: Lomotil  Thank you,  Dr. Jackquline Denmark

## 2018-11-20 ENCOUNTER — Encounter: Payer: Self-pay | Admitting: Gastroenterology

## 2018-11-20 DIAGNOSIS — Z111 Encounter for screening for respiratory tuberculosis: Secondary | ICD-10-CM | POA: Diagnosis not present

## 2018-11-20 DIAGNOSIS — Z022 Encounter for examination for admission to residential institution: Secondary | ICD-10-CM | POA: Diagnosis not present

## 2018-11-23 DIAGNOSIS — Z111 Encounter for screening for respiratory tuberculosis: Secondary | ICD-10-CM | POA: Diagnosis not present

## 2018-12-04 ENCOUNTER — Encounter: Payer: PPO | Admitting: Gastroenterology

## 2018-12-06 ENCOUNTER — Ambulatory Visit (AMBULATORY_SURGERY_CENTER): Payer: PPO | Admitting: Gastroenterology

## 2018-12-06 ENCOUNTER — Encounter: Payer: Self-pay | Admitting: Gastroenterology

## 2018-12-06 VITALS — BP 141/95 | HR 67 | Temp 97.1°F | Resp 30 | Ht 62.0 in | Wt 155.0 lb

## 2018-12-06 DIAGNOSIS — K635 Polyp of colon: Secondary | ICD-10-CM

## 2018-12-06 DIAGNOSIS — R197 Diarrhea, unspecified: Secondary | ICD-10-CM

## 2018-12-06 DIAGNOSIS — K52832 Lymphocytic colitis: Secondary | ICD-10-CM

## 2018-12-06 DIAGNOSIS — D122 Benign neoplasm of ascending colon: Secondary | ICD-10-CM

## 2018-12-06 MED ORDER — SODIUM CHLORIDE 0.9 % IV SOLN: 500.0000 mL | Freq: Once | INTRAVENOUS | Status: DC

## 2018-12-06 NOTE — Progress Notes (Signed)
PT taken to PACU. Monitors in place. VSS. Report given to RN. 

## 2018-12-06 NOTE — Patient Instructions (Signed)
Please read handouts provided. Await pathology results. Continue present medications. No aspirin, ibuprofen, naproxen, or other non-steriodal anti-inflammatory drugs for 5 days.      YOU HAD AN ENDOSCOPIC PROCEDURE TODAY AT [facility]:   Refer to the procedure report that was given to you for any specific questions about what was found during the examination.  If the procedure report does not answer your questions, please call your gastroenterologist to clarify.  If you requested that your care partner not be given the details of your procedure findings, then the procedure report has been included in a sealed envelope for you to review at your convenience later.  YOU SHOULD EXPECT: Some feelings of bloating in the abdomen. Passage of more gas than usual.  Walking can help get rid of the air that was put into your GI tract during the procedure and reduce the bloating. If you had a lower endoscopy (such as a colonoscopy or flexible sigmoidoscopy) you may notice spotting of blood in your stool or on the toilet paper. If you underwent a bowel prep for your procedure, you may not have a normal bowel movement for a few days.  Please Note:  You might notice some irritation and congestion in your nose or some drainage.  This is from the oxygen used during your procedure.  There is no need for concern and it should clear up in a day or so.  SYMPTOMS TO REPORT IMMEDIATELY:   Following lower endoscopy (colonoscopy or flexible sigmoidoscopy):  Excessive amounts of blood in the stool  Significant tenderness or worsening of abdominal pains  Swelling of the abdomen that is new, acute  Fever of 100F or higher   owi For urgent or emergent issues, a gastroenterologist can be reached at any hour by calling [phone].   DIET:  We do recommend a small meal at first, but then you may proceed to your regular diet.  Drink plenty of fluids but you should avoid alcoholic beverages for 24  hours.  ACTIVITY:  You should plan to take it easy for the rest of today and you should NOT DRIVE or use heavy machinery until tomorrow (because of the sedation medicines used during the test).    FOLLOW UP: Our staff will call the number listed on your records the next business day following your procedure to check on you and address any questions or concerns that you may have regarding the information given to you following your procedure. If we do not reach you, we will leave a message.  However, if you are feeling well and you are not experiencing any problems, there is no need to return our call.  We will assume that you have returned to your regular daily activities without incident.  If any biopsies were taken you will be contacted by phone or by letter within the next 1-3 weeks.  Please call us at [phone] if you have not heard about the biopsies in 3 weeks.    SIGNATURES/CONFIDENTIALITY: You and/or your care partner have signed paperwork which will be entered into your electronic medical record.  These signatures attest to the fact that that the information above on your After Visit Summary has been reviewed and is understood.  Full responsibility of the confidentiality of this discharge information lies with you and/or your care-partner.      YOU HAD AN ENDOSCOPIC PROCEDURE TODAY AT THE [facility]:   Refer to the procedure report that was given to you  for any specific questions about what was found during the examination.  If the procedure report does not answer your questions, please call your gastroenterologist to clarify.  If you requested that your care partner not be given the details of your procedure findings, then the procedure report has been included in a sealed envelope for you to review at your convenience later.  YOU SHOULD EXPECT: Some feelings of bloating in the abdomen. Passage of more gas than usual.  Walking can help get rid of the air that was put  into your GI tract during the procedure and reduce the bloating. If you had a lower endoscopy (such as a colonoscopy or flexible sigmoidoscopy) you may notice spotting of blood in your stool or on the toilet paper. If you underwent a bowel prep for your procedure, you may not have a normal bowel movement for a few days.  Please Note:  You might notice some irritation and congestion in your nose or some drainage.  This is from the oxygen used during your procedure.  There is no need for concern and it should clear up in a day or so.  SYMPTOMS TO REPORT IMMEDIATELY:     Following upper endoscopy (EGD)  Vomiting of blood or coffee ground material  New chest pain or pain under the shoulder blades  Painful or persistently difficult swallowing  New shortness of breath  Fever of 100F or higher  Black, tarry-looking stools  For urgent or emergent issues, a gastroenterologist can be reached at any hour by calling [phone].   DIET:  We do recommend a small meal at first, but then you may proceed to your regular diet.  Drink plenty of fluids but you should avoid alcoholic beverages for 24 hours.  ACTIVITY:  You should plan to take it easy for the rest of today and you should NOT DRIVE or use heavy machinery until tomorrow (because of the sedation medicines used during the test).    FOLLOW UP: Our staff will call the number listed on your records the next business day following your procedure to check on you and address any questions or concerns that you may have regarding the information given to you following your procedure. If we do not reach you, we will leave a message.  However, if you are feeling well and you are not experiencing any problems, there is no need to return our call.  We will assume that you have returned to your regular daily activities without incident.  If any biopsies were taken you will be contacted by phone or by letter within the next 1-3 weeks.  Please call us at  [phone] if you have not heard about the biopsies in 3 weeks.    SIGNATURES/CONFIDENTIALITY: You and/or your care partner have signed paperwork which will be entered into your electronic medical record.  These signatures attest to the fact that that the information above on your After Visit Summary has been reviewed and is understood.  Full responsibility of the confidentiality of this discharge information lies with you and/or your care-partner.

## 2018-12-06 NOTE — Progress Notes (Signed)
Called to room to assist during endoscopic procedure.  Patient ID and intended procedure confirmed with present staff. Received instructions for my participation in the procedure from the performing physician.  

## 2018-12-06 NOTE — Op Note (Signed)
[place] Patient Name: Bonnie Cook Procedure Date: 12/06/2018 10:27 AM MRN: [id number] Endoscopist: Jackquline Denmark , MD Age: 68 Referring MD:  Date of Birth: 10-17-1951 Gender: Female Account #: [id number] Procedure:                Colonoscopy Indications:              Chronic diarrhea Medicines:                Monitored Anesthesia Care Procedure:                Pre-Anesthesia Assessment:                           - Prior to the procedure, a History and Physical                            was performed, and patient medications and                            allergies were reviewed. The patient's tolerance of                            previous anesthesia was also reviewed. The risks                            and benefits of the procedure and the sedation                            options and risks were discussed with the patient.                            All questions were answered, and informed consent                            was obtained. Prior Anticoagulants: The patient has                            taken no previous anticoagulant or antiplatelet                            agents. ASA Grade Assessment: II - A patient with                            mild systemic disease. After reviewing the risks                            and benefits, the patient was deemed in                            satisfactory condition to undergo the procedure.                           After obtaining informed consent, the colonoscope  was passed under direct vision. Throughout the                            procedure, the patient's blood pressure, pulse, and                            oxygen saturations were monitored continuously. The                            Colonoscope was introduced through the anus and                            advanced to the 2 cm into the ileum. The                            colonoscopy was performed without difficulty. The                       patient tolerated the procedure well. The quality                            of the bowel preparation was good. Scope In: 10:33:15 AM Scope Out: 10:51:29 AM Scope Withdrawal Time: 0 hours 14 minutes 20 seconds  Total Procedure Duration: 0 hours 18 minutes 14 seconds  Findings:                 A 12 mm polyp was found in the proximal ascending                            colon. The polyp was sessile. The polyp was removed                            with a piecemeal technique using a hot snare.                            Resection and retrieval were complete. Estimated                            blood loss: none.                           A few small-mouthed diverticula were found in the                            sigmoid colon. Biopsies for histology were taken                            with a cold forceps from throughout the colon for                            evaluation of microscopic colitis.                           Non-bleeding internal hemorrhoids were found during  retroflexion. The hemorrhoids were small.                           The terminal ileum appeared normal. Complications:            No immediate complications. Estimated Blood Loss:     Estimated blood loss: none. Impression:               -Colonic polyp status post piecemeal polypectomy.                           -Mild sigmoid diverticulosis.                           -Otherwise normal colonoscopy to TI. Recommendation:           - Patient has a contact number available for                            emergencies. The signs and symptoms of potential                            delayed complications were discussed with the                            patient. Return to normal activities tomorrow.                            Written discharge instructions were provided to the                            patient.                           - Resume previous diet.                            - Continue present medications.                           - Await pathology results.                           - Repeat colonoscopy for surveillance based on                            pathology results.                           - No aspirin, ibuprofen, naproxen, or other                            non-steroidal anti-inflammatory drugs for 5 days                            after polyp removal.                           -  Return to GI clinic PRN. Jackquline Denmark, MD 12/06/2018 10:57:07 AM This report has been signed electronically.

## 2018-12-06 NOTE — Progress Notes (Signed)
History reviewed today 

## 2018-12-07 ENCOUNTER — Telehealth: Payer: Self-pay

## 2018-12-07 NOTE — Telephone Encounter (Signed)
  Follow up Call-  Call back number 12/06/2018  Post procedure Call Back phone  # [phone] (daughter will give pt's cell #)  Permission to leave phone message Yes  Some recent data might be hidden     Patient questions:  Do you have a fever, pain , or abdominal swelling? No. Pain Score  0 *  Have you tolerated food without any problems? Yes.    Have you been able to return to your normal activities? Yes.    Do you have any questions about your discharge instructions: Diet   No. Medications  No. Follow up visit  No.  Do you have questions or concerns about your Care? No.  Actions: * If pain score is 4 or above: No action needed, pain <4.

## 2018-12-12 ENCOUNTER — Encounter: Payer: Self-pay | Admitting: Gastroenterology

## 2018-12-20 ENCOUNTER — Telehealth: Payer: Self-pay | Admitting: Gastroenterology

## 2018-12-20 MED ORDER — DIPHENOXYLATE-ATROPINE 2.5-0.025 MG PO TABS: 1.0000 | ORAL_TABLET | Freq: Three times a day (TID) | ORAL | 0 refills | Status: AC | PRN

## 2018-12-20 NOTE — Telephone Encounter (Signed)
Sent prescription to patients pharmacy, patient was notified.

## 2018-12-24 ENCOUNTER — Telehealth: Payer: Self-pay | Admitting: Gastroenterology

## 2018-12-24 NOTE — Telephone Encounter (Signed)
Spoke with pt daughter she stated power was off at the pharm and they stated never got the request to refill. Please resend thanks

## 2018-12-25 NOTE — Telephone Encounter (Signed)
Faxed prescription again.

## 2018-12-31 DIAGNOSIS — E871 Hypo-osmolality and hyponatremia: Secondary | ICD-10-CM | POA: Diagnosis not present

## 2018-12-31 DIAGNOSIS — Z0000 Encounter for general adult medical examination without abnormal findings: Secondary | ICD-10-CM | POA: Diagnosis not present

## 2018-12-31 DIAGNOSIS — R350 Frequency of micturition: Secondary | ICD-10-CM | POA: Diagnosis not present

## 2018-12-31 DIAGNOSIS — E1165 Type 2 diabetes mellitus with hyperglycemia: Secondary | ICD-10-CM | POA: Diagnosis not present

## 2019-01-07 DIAGNOSIS — F0280 Dementia in other diseases classified elsewhere without behavioral disturbance: Secondary | ICD-10-CM | POA: Diagnosis not present

## 2019-01-07 DIAGNOSIS — R569 Unspecified convulsions: Secondary | ICD-10-CM | POA: Diagnosis not present

## 2019-01-07 DIAGNOSIS — G988 Other disorders of nervous system: Secondary | ICD-10-CM | POA: Diagnosis not present

## 2019-01-07 DIAGNOSIS — R269 Unspecified abnormalities of gait and mobility: Secondary | ICD-10-CM | POA: Diagnosis not present

## 2019-01-07 DIAGNOSIS — R296 Repeated falls: Secondary | ICD-10-CM | POA: Diagnosis not present

## 2019-01-07 NOTE — Telephone Encounter (Signed)
Lets continue Lomotil as she does feel better. Lets wait for neurology appointment I am glad she is feeling better

## 2019-01-14 DIAGNOSIS — E782 Mixed hyperlipidemia: Secondary | ICD-10-CM | POA: Diagnosis not present

## 2019-01-14 DIAGNOSIS — Z022 Encounter for examination for admission to residential institution: Secondary | ICD-10-CM | POA: Diagnosis not present

## 2019-01-14 DIAGNOSIS — F329 Major depressive disorder, single episode, unspecified: Secondary | ICD-10-CM | POA: Diagnosis not present

## 2019-01-14 DIAGNOSIS — E1165 Type 2 diabetes mellitus with hyperglycemia: Secondary | ICD-10-CM | POA: Diagnosis not present

## 2019-01-14 DIAGNOSIS — R399 Unspecified symptoms and signs involving the genitourinary system: Secondary | ICD-10-CM | POA: Diagnosis not present

## 2019-01-14 DIAGNOSIS — F0390 Unspecified dementia without behavioral disturbance: Secondary | ICD-10-CM | POA: Diagnosis not present

## 2019-03-14 DIAGNOSIS — R410 Disorientation, unspecified: Secondary | ICD-10-CM | POA: Diagnosis not present

## 2019-03-14 DIAGNOSIS — S0081XA Abrasion of other part of head, initial encounter: Secondary | ICD-10-CM | POA: Diagnosis not present

## 2019-03-14 DIAGNOSIS — I10 Essential (primary) hypertension: Secondary | ICD-10-CM | POA: Diagnosis not present

## 2019-03-14 DIAGNOSIS — S0990XA Unspecified injury of head, initial encounter: Secondary | ICD-10-CM | POA: Diagnosis not present

## 2019-03-14 DIAGNOSIS — R58 Hemorrhage, not elsewhere classified: Secondary | ICD-10-CM | POA: Diagnosis not present

## 2019-03-14 DIAGNOSIS — R52 Pain, unspecified: Secondary | ICD-10-CM | POA: Diagnosis not present

## 2019-03-14 DIAGNOSIS — R0902 Hypoxemia: Secondary | ICD-10-CM | POA: Diagnosis not present

## 2019-03-14 DIAGNOSIS — S199XXA Unspecified injury of neck, initial encounter: Secondary | ICD-10-CM | POA: Diagnosis not present

## 2019-04-11 DIAGNOSIS — F0390 Unspecified dementia without behavioral disturbance: Secondary | ICD-10-CM | POA: Diagnosis not present

## 2019-04-23 DIAGNOSIS — L03031 Cellulitis of right toe: Secondary | ICD-10-CM | POA: Diagnosis not present

## 2019-04-23 DIAGNOSIS — L600 Ingrowing nail: Secondary | ICD-10-CM | POA: Diagnosis not present

## 2019-04-23 DIAGNOSIS — L609 Nail disorder, unspecified: Secondary | ICD-10-CM | POA: Diagnosis not present

## 2019-07-16 DIAGNOSIS — L609 Nail disorder, unspecified: Secondary | ICD-10-CM | POA: Diagnosis not present

## 2019-07-16 DIAGNOSIS — L600 Ingrowing nail: Secondary | ICD-10-CM | POA: Diagnosis not present

## 2019-07-16 DIAGNOSIS — L03031 Cellulitis of right toe: Secondary | ICD-10-CM | POA: Diagnosis not present

## 2019-08-02 DIAGNOSIS — Z20828 Contact with and (suspected) exposure to other viral communicable diseases: Secondary | ICD-10-CM | POA: Diagnosis not present

## 2019-11-05 DIAGNOSIS — L03031 Cellulitis of right toe: Secondary | ICD-10-CM | POA: Diagnosis not present

## 2019-11-05 DIAGNOSIS — L600 Ingrowing nail: Secondary | ICD-10-CM | POA: Diagnosis not present

## 2019-11-05 DIAGNOSIS — L609 Nail disorder, unspecified: Secondary | ICD-10-CM | POA: Diagnosis not present

## 2019-11-10 ENCOUNTER — Inpatient Hospital Stay: Admission: AD | Admit: 2019-11-10 | Payer: PPO | Source: Other Acute Inpatient Hospital | Admitting: Internal Medicine

## 2019-11-10 DIAGNOSIS — G9341 Metabolic encephalopathy: Secondary | ICD-10-CM | POA: Diagnosis not present

## 2019-11-10 DIAGNOSIS — S3993XA Unspecified injury of pelvis, initial encounter: Secondary | ICD-10-CM | POA: Diagnosis not present

## 2019-11-10 DIAGNOSIS — R410 Disorientation, unspecified: Secondary | ICD-10-CM | POA: Diagnosis not present

## 2019-11-10 DIAGNOSIS — I160 Hypertensive urgency: Secondary | ICD-10-CM | POA: Diagnosis not present

## 2019-11-10 DIAGNOSIS — S8992XA Unspecified injury of left lower leg, initial encounter: Secondary | ICD-10-CM | POA: Diagnosis not present

## 2019-11-10 DIAGNOSIS — S0990XA Unspecified injury of head, initial encounter: Secondary | ICD-10-CM | POA: Diagnosis not present

## 2019-11-10 DIAGNOSIS — S199XXA Unspecified injury of neck, initial encounter: Secondary | ICD-10-CM | POA: Diagnosis not present

## 2019-11-10 DIAGNOSIS — E119 Type 2 diabetes mellitus without complications: Secondary | ICD-10-CM | POA: Diagnosis not present

## 2019-11-10 DIAGNOSIS — S299XXA Unspecified injury of thorax, initial encounter: Secondary | ICD-10-CM | POA: Diagnosis not present

## 2019-11-10 DIAGNOSIS — W19XXXA Unspecified fall, initial encounter: Secondary | ICD-10-CM | POA: Diagnosis not present

## 2019-11-10 DIAGNOSIS — R0902 Hypoxemia: Secondary | ICD-10-CM | POA: Diagnosis not present

## 2019-11-10 DIAGNOSIS — F0390 Unspecified dementia without behavioral disturbance: Secondary | ICD-10-CM | POA: Diagnosis not present

## 2019-11-10 DIAGNOSIS — N390 Urinary tract infection, site not specified: Secondary | ICD-10-CM | POA: Diagnosis not present

## 2019-11-10 DIAGNOSIS — J1289 Other viral pneumonia: Secondary | ICD-10-CM | POA: Diagnosis not present

## 2019-11-10 DIAGNOSIS — U071 COVID-19: Secondary | ICD-10-CM | POA: Diagnosis not present

## 2019-11-11 DIAGNOSIS — Z79899 Other long term (current) drug therapy: Secondary | ICD-10-CM | POA: Diagnosis not present

## 2019-11-11 DIAGNOSIS — G3183 Dementia with Lewy bodies: Secondary | ICD-10-CM | POA: Diagnosis not present

## 2019-11-11 DIAGNOSIS — S3993XA Unspecified injury of pelvis, initial encounter: Secondary | ICD-10-CM | POA: Diagnosis not present

## 2019-11-11 DIAGNOSIS — E860 Dehydration: Secondary | ICD-10-CM | POA: Diagnosis not present

## 2019-11-11 DIAGNOSIS — Z2821 Immunization not carried out because of patient refusal: Secondary | ICD-10-CM | POA: Diagnosis not present

## 2019-11-11 DIAGNOSIS — Z7982 Long term (current) use of aspirin: Secondary | ICD-10-CM | POA: Diagnosis not present

## 2019-11-11 DIAGNOSIS — E119 Type 2 diabetes mellitus without complications: Secondary | ICD-10-CM | POA: Diagnosis not present

## 2019-11-11 DIAGNOSIS — R5381 Other malaise: Secondary | ICD-10-CM | POA: Diagnosis not present

## 2019-11-11 DIAGNOSIS — N390 Urinary tract infection, site not specified: Secondary | ICD-10-CM | POA: Diagnosis not present

## 2019-11-11 DIAGNOSIS — G40909 Epilepsy, unspecified, not intractable, without status epilepticus: Secondary | ICD-10-CM | POA: Diagnosis not present

## 2019-11-11 DIAGNOSIS — S8992XA Unspecified injury of left lower leg, initial encounter: Secondary | ICD-10-CM | POA: Diagnosis not present

## 2019-11-11 DIAGNOSIS — S199XXA Unspecified injury of neck, initial encounter: Secondary | ICD-10-CM | POA: Diagnosis not present

## 2019-11-11 DIAGNOSIS — J1289 Other viral pneumonia: Secondary | ICD-10-CM | POA: Diagnosis not present

## 2019-11-11 DIAGNOSIS — R7401 Elevation of levels of liver transaminase levels: Secondary | ICD-10-CM | POA: Diagnosis not present

## 2019-11-11 DIAGNOSIS — B9689 Other specified bacterial agents as the cause of diseases classified elsewhere: Secondary | ICD-10-CM | POA: Diagnosis not present

## 2019-11-11 DIAGNOSIS — R936 Abnormal findings on diagnostic imaging of limbs: Secondary | ICD-10-CM | POA: Diagnosis not present

## 2019-11-11 DIAGNOSIS — G9341 Metabolic encephalopathy: Secondary | ICD-10-CM | POA: Diagnosis not present

## 2019-11-11 DIAGNOSIS — Z8673 Personal history of transient ischemic attack (TIA), and cerebral infarction without residual deficits: Secondary | ICD-10-CM | POA: Diagnosis not present

## 2019-11-11 DIAGNOSIS — Z7984 Long term (current) use of oral hypoglycemic drugs: Secondary | ICD-10-CM | POA: Diagnosis not present

## 2019-11-11 DIAGNOSIS — S299XXA Unspecified injury of thorax, initial encounter: Secondary | ICD-10-CM | POA: Diagnosis not present

## 2019-11-11 DIAGNOSIS — I160 Hypertensive urgency: Secondary | ICD-10-CM | POA: Diagnosis not present

## 2019-11-11 DIAGNOSIS — U071 COVID-19: Secondary | ICD-10-CM | POA: Diagnosis not present

## 2019-11-11 DIAGNOSIS — S0990XA Unspecified injury of head, initial encounter: Secondary | ICD-10-CM | POA: Diagnosis not present

## 2019-11-11 DIAGNOSIS — F0280 Dementia in other diseases classified elsewhere without behavioral disturbance: Secondary | ICD-10-CM | POA: Diagnosis not present

## 2019-11-11 DIAGNOSIS — F0390 Unspecified dementia without behavioral disturbance: Secondary | ICD-10-CM | POA: Diagnosis not present

## 2019-11-11 DIAGNOSIS — E871 Hypo-osmolality and hyponatremia: Secondary | ICD-10-CM | POA: Diagnosis not present

## 2019-11-11 DIAGNOSIS — J9600 Acute respiratory failure, unspecified whether with hypoxia or hypercapnia: Secondary | ICD-10-CM | POA: Diagnosis not present

## 2019-11-14 DIAGNOSIS — G9341 Metabolic encephalopathy: Secondary | ICD-10-CM

## 2019-11-14 DIAGNOSIS — J1289 Other viral pneumonia: Secondary | ICD-10-CM | POA: Diagnosis not present

## 2019-11-14 DIAGNOSIS — F0390 Unspecified dementia without behavioral disturbance: Secondary | ICD-10-CM

## 2019-11-14 DIAGNOSIS — U071 COVID-19: Secondary | ICD-10-CM

## 2019-11-14 DIAGNOSIS — N289 Disorder of kidney and ureter, unspecified: Secondary | ICD-10-CM

## 2019-11-14 DIAGNOSIS — F0280 Dementia in other diseases classified elsewhere without behavioral disturbance: Secondary | ICD-10-CM | POA: Diagnosis not present

## 2019-11-14 DIAGNOSIS — R0902 Hypoxemia: Secondary | ICD-10-CM | POA: Diagnosis not present

## 2019-11-14 DIAGNOSIS — R918 Other nonspecific abnormal finding of lung field: Secondary | ICD-10-CM | POA: Diagnosis not present

## 2019-11-14 DIAGNOSIS — R4182 Altered mental status, unspecified: Secondary | ICD-10-CM | POA: Diagnosis not present

## 2019-11-14 DIAGNOSIS — R531 Weakness: Secondary | ICD-10-CM | POA: Diagnosis not present

## 2019-11-15 DIAGNOSIS — G9341 Metabolic encephalopathy: Secondary | ICD-10-CM | POA: Diagnosis not present

## 2019-11-15 DIAGNOSIS — J1289 Other viral pneumonia: Secondary | ICD-10-CM | POA: Diagnosis not present

## 2019-11-15 DIAGNOSIS — R918 Other nonspecific abnormal finding of lung field: Secondary | ICD-10-CM | POA: Diagnosis not present

## 2019-11-15 DIAGNOSIS — Z79899 Other long term (current) drug therapy: Secondary | ICD-10-CM | POA: Diagnosis not present

## 2019-11-15 DIAGNOSIS — Z881 Allergy status to other antibiotic agents status: Secondary | ICD-10-CM | POA: Diagnosis not present

## 2019-11-15 DIAGNOSIS — F4321 Adjustment disorder with depressed mood: Secondary | ICD-10-CM | POA: Diagnosis not present

## 2019-11-15 DIAGNOSIS — E7800 Pure hypercholesterolemia, unspecified: Secondary | ICD-10-CM | POA: Diagnosis not present

## 2019-11-15 DIAGNOSIS — E0781 Sick-euthyroid syndrome: Secondary | ICD-10-CM | POA: Diagnosis not present

## 2019-11-15 DIAGNOSIS — Z880 Allergy status to penicillin: Secondary | ICD-10-CM | POA: Diagnosis not present

## 2019-11-15 DIAGNOSIS — N179 Acute kidney failure, unspecified: Secondary | ICD-10-CM | POA: Diagnosis not present

## 2019-11-15 DIAGNOSIS — Z7984 Long term (current) use of oral hypoglycemic drugs: Secondary | ICD-10-CM | POA: Diagnosis not present

## 2019-11-15 DIAGNOSIS — Z7982 Long term (current) use of aspirin: Secondary | ICD-10-CM | POA: Diagnosis not present

## 2019-11-15 DIAGNOSIS — K219 Gastro-esophageal reflux disease without esophagitis: Secondary | ICD-10-CM | POA: Diagnosis not present

## 2019-11-15 DIAGNOSIS — N390 Urinary tract infection, site not specified: Secondary | ICD-10-CM | POA: Diagnosis not present

## 2019-11-15 DIAGNOSIS — E119 Type 2 diabetes mellitus without complications: Secondary | ICD-10-CM | POA: Diagnosis not present

## 2019-11-15 DIAGNOSIS — G40909 Epilepsy, unspecified, not intractable, without status epilepticus: Secondary | ICD-10-CM | POA: Diagnosis not present

## 2019-11-15 DIAGNOSIS — F0280 Dementia in other diseases classified elsewhere without behavioral disturbance: Secondary | ICD-10-CM | POA: Diagnosis not present

## 2019-11-15 DIAGNOSIS — F0390 Unspecified dementia without behavioral disturbance: Secondary | ICD-10-CM | POA: Diagnosis not present

## 2019-11-15 DIAGNOSIS — M1990 Unspecified osteoarthritis, unspecified site: Secondary | ICD-10-CM | POA: Diagnosis not present

## 2019-11-15 DIAGNOSIS — F419 Anxiety disorder, unspecified: Secondary | ICD-10-CM | POA: Diagnosis not present

## 2019-11-15 DIAGNOSIS — R7989 Other specified abnormal findings of blood chemistry: Secondary | ICD-10-CM | POA: Diagnosis not present

## 2019-11-15 DIAGNOSIS — I10 Essential (primary) hypertension: Secondary | ICD-10-CM | POA: Diagnosis not present

## 2019-11-15 DIAGNOSIS — Z8673 Personal history of transient ischemic attack (TIA), and cerebral infarction without residual deficits: Secondary | ICD-10-CM | POA: Diagnosis not present

## 2019-11-15 DIAGNOSIS — R4182 Altered mental status, unspecified: Secondary | ICD-10-CM | POA: Diagnosis not present

## 2019-11-15 DIAGNOSIS — N289 Disorder of kidney and ureter, unspecified: Secondary | ICD-10-CM | POA: Diagnosis not present

## 2019-11-15 DIAGNOSIS — E342 Ectopic hormone secretion, not elsewhere classified: Secondary | ICD-10-CM | POA: Diagnosis not present

## 2019-11-15 DIAGNOSIS — U071 COVID-19: Secondary | ICD-10-CM | POA: Diagnosis not present

## 2019-11-15 DIAGNOSIS — G3183 Dementia with Lewy bodies: Secondary | ICD-10-CM | POA: Diagnosis not present

## 2019-11-15 DIAGNOSIS — E860 Dehydration: Secondary | ICD-10-CM | POA: Diagnosis not present

## 2019-11-27 DIAGNOSIS — M79631 Pain in right forearm: Secondary | ICD-10-CM | POA: Diagnosis not present

## 2019-11-27 DIAGNOSIS — R350 Frequency of micturition: Secondary | ICD-10-CM | POA: Diagnosis not present

## 2019-11-27 DIAGNOSIS — R29898 Other symptoms and signs involving the musculoskeletal system: Secondary | ICD-10-CM | POA: Diagnosis not present

## 2019-11-27 DIAGNOSIS — M79621 Pain in right upper arm: Secondary | ICD-10-CM | POA: Diagnosis not present

## 2019-11-27 DIAGNOSIS — M79641 Pain in right hand: Secondary | ICD-10-CM | POA: Diagnosis not present

## 2019-11-27 DIAGNOSIS — M7989 Other specified soft tissue disorders: Secondary | ICD-10-CM | POA: Diagnosis not present

## 2020-01-13 DIAGNOSIS — Z8616 Personal history of COVID-19: Secondary | ICD-10-CM | POA: Diagnosis not present

## 2020-01-13 DIAGNOSIS — J45909 Unspecified asthma, uncomplicated: Secondary | ICD-10-CM | POA: Diagnosis not present

## 2020-01-13 DIAGNOSIS — Z8673 Personal history of transient ischemic attack (TIA), and cerebral infarction without residual deficits: Secondary | ICD-10-CM | POA: Diagnosis not present

## 2020-01-13 DIAGNOSIS — K219 Gastro-esophageal reflux disease without esophagitis: Secondary | ICD-10-CM | POA: Diagnosis not present

## 2020-01-13 DIAGNOSIS — F329 Major depressive disorder, single episode, unspecified: Secondary | ICD-10-CM | POA: Diagnosis not present

## 2020-01-13 DIAGNOSIS — E782 Mixed hyperlipidemia: Secondary | ICD-10-CM | POA: Diagnosis not present

## 2020-01-13 DIAGNOSIS — Z9181 History of falling: Secondary | ICD-10-CM | POA: Diagnosis not present

## 2020-01-13 DIAGNOSIS — N390 Urinary tract infection, site not specified: Secondary | ICD-10-CM | POA: Diagnosis not present

## 2020-01-13 DIAGNOSIS — Z7982 Long term (current) use of aspirin: Secondary | ICD-10-CM | POA: Diagnosis not present

## 2020-01-13 DIAGNOSIS — M150 Primary generalized (osteo)arthritis: Secondary | ICD-10-CM | POA: Diagnosis not present

## 2020-01-13 DIAGNOSIS — I10 Essential (primary) hypertension: Secondary | ICD-10-CM | POA: Diagnosis not present

## 2020-01-13 DIAGNOSIS — G43409 Hemiplegic migraine, not intractable, without status migrainosus: Secondary | ICD-10-CM | POA: Diagnosis not present

## 2020-01-13 DIAGNOSIS — E7800 Pure hypercholesterolemia, unspecified: Secondary | ICD-10-CM | POA: Diagnosis not present

## 2020-01-13 DIAGNOSIS — R350 Frequency of micturition: Secondary | ICD-10-CM | POA: Diagnosis not present

## 2020-01-13 DIAGNOSIS — E871 Hypo-osmolality and hyponatremia: Secondary | ICD-10-CM | POA: Diagnosis not present

## 2020-01-13 DIAGNOSIS — E538 Deficiency of other specified B group vitamins: Secondary | ICD-10-CM | POA: Diagnosis not present

## 2020-01-13 DIAGNOSIS — E1165 Type 2 diabetes mellitus with hyperglycemia: Secondary | ICD-10-CM | POA: Diagnosis not present

## 2020-01-13 DIAGNOSIS — F0280 Dementia in other diseases classified elsewhere without behavioral disturbance: Secondary | ICD-10-CM | POA: Diagnosis not present

## 2020-01-13 DIAGNOSIS — Z8701 Personal history of pneumonia (recurrent): Secondary | ICD-10-CM | POA: Diagnosis not present

## 2020-01-13 DIAGNOSIS — Z7984 Long term (current) use of oral hypoglycemic drugs: Secondary | ICD-10-CM | POA: Diagnosis not present

## 2020-01-13 DIAGNOSIS — U071 COVID-19: Secondary | ICD-10-CM | POA: Diagnosis not present

## 2020-01-13 DIAGNOSIS — R569 Unspecified convulsions: Secondary | ICD-10-CM | POA: Diagnosis not present

## 2020-02-04 DIAGNOSIS — L609 Nail disorder, unspecified: Secondary | ICD-10-CM | POA: Diagnosis not present

## 2020-02-04 DIAGNOSIS — B351 Tinea unguium: Secondary | ICD-10-CM | POA: Diagnosis not present

## 2020-02-12 DIAGNOSIS — J45909 Unspecified asthma, uncomplicated: Secondary | ICD-10-CM | POA: Diagnosis not present

## 2020-02-12 DIAGNOSIS — Z9181 History of falling: Secondary | ICD-10-CM | POA: Diagnosis not present

## 2020-02-12 DIAGNOSIS — E871 Hypo-osmolality and hyponatremia: Secondary | ICD-10-CM | POA: Diagnosis not present

## 2020-02-12 DIAGNOSIS — Z7982 Long term (current) use of aspirin: Secondary | ICD-10-CM | POA: Diagnosis not present

## 2020-02-12 DIAGNOSIS — E7800 Pure hypercholesterolemia, unspecified: Secondary | ICD-10-CM | POA: Diagnosis not present

## 2020-02-12 DIAGNOSIS — F0280 Dementia in other diseases classified elsewhere without behavioral disturbance: Secondary | ICD-10-CM | POA: Diagnosis not present

## 2020-02-12 DIAGNOSIS — M150 Primary generalized (osteo)arthritis: Secondary | ICD-10-CM | POA: Diagnosis not present

## 2020-02-12 DIAGNOSIS — Z8701 Personal history of pneumonia (recurrent): Secondary | ICD-10-CM | POA: Diagnosis not present

## 2020-02-12 DIAGNOSIS — N390 Urinary tract infection, site not specified: Secondary | ICD-10-CM | POA: Diagnosis not present

## 2020-02-12 DIAGNOSIS — Z8616 Personal history of COVID-19: Secondary | ICD-10-CM | POA: Diagnosis not present

## 2020-02-12 DIAGNOSIS — Z8673 Personal history of transient ischemic attack (TIA), and cerebral infarction without residual deficits: Secondary | ICD-10-CM | POA: Diagnosis not present

## 2020-02-12 DIAGNOSIS — R350 Frequency of micturition: Secondary | ICD-10-CM | POA: Diagnosis not present

## 2020-02-12 DIAGNOSIS — E538 Deficiency of other specified B group vitamins: Secondary | ICD-10-CM | POA: Diagnosis not present

## 2020-02-12 DIAGNOSIS — I10 Essential (primary) hypertension: Secondary | ICD-10-CM | POA: Diagnosis not present

## 2020-02-12 DIAGNOSIS — G43409 Hemiplegic migraine, not intractable, without status migrainosus: Secondary | ICD-10-CM | POA: Diagnosis not present

## 2020-02-12 DIAGNOSIS — Z7984 Long term (current) use of oral hypoglycemic drugs: Secondary | ICD-10-CM | POA: Diagnosis not present

## 2020-02-12 DIAGNOSIS — K219 Gastro-esophageal reflux disease without esophagitis: Secondary | ICD-10-CM | POA: Diagnosis not present

## 2020-02-12 DIAGNOSIS — E782 Mixed hyperlipidemia: Secondary | ICD-10-CM | POA: Diagnosis not present

## 2020-02-12 DIAGNOSIS — R569 Unspecified convulsions: Secondary | ICD-10-CM | POA: Diagnosis not present

## 2020-02-12 DIAGNOSIS — E1165 Type 2 diabetes mellitus with hyperglycemia: Secondary | ICD-10-CM | POA: Diagnosis not present

## 2020-02-12 DIAGNOSIS — F329 Major depressive disorder, single episode, unspecified: Secondary | ICD-10-CM | POA: Diagnosis not present

## 2020-05-05 DIAGNOSIS — M79671 Pain in right foot: Secondary | ICD-10-CM | POA: Diagnosis not present

## 2020-05-05 DIAGNOSIS — L609 Nail disorder, unspecified: Secondary | ICD-10-CM | POA: Diagnosis not present

## 2020-05-05 DIAGNOSIS — L600 Ingrowing nail: Secondary | ICD-10-CM | POA: Diagnosis not present

## 2020-08-04 DIAGNOSIS — M79671 Pain in right foot: Secondary | ICD-10-CM | POA: Diagnosis not present

## 2020-08-04 DIAGNOSIS — L600 Ingrowing nail: Secondary | ICD-10-CM | POA: Diagnosis not present

## 2020-08-04 DIAGNOSIS — L609 Nail disorder, unspecified: Secondary | ICD-10-CM | POA: Diagnosis not present

## 2020-10-09 DIAGNOSIS — F0390 Unspecified dementia without behavioral disturbance: Secondary | ICD-10-CM | POA: Diagnosis not present

## 2020-10-09 DIAGNOSIS — R5381 Other malaise: Secondary | ICD-10-CM | POA: Diagnosis not present

## 2020-10-09 DIAGNOSIS — E782 Mixed hyperlipidemia: Secondary | ICD-10-CM | POA: Diagnosis not present

## 2020-10-09 DIAGNOSIS — I10 Essential (primary) hypertension: Secondary | ICD-10-CM | POA: Diagnosis not present

## 2020-10-09 DIAGNOSIS — Z23 Encounter for immunization: Secondary | ICD-10-CM | POA: Diagnosis not present

## 2020-10-09 DIAGNOSIS — R8281 Pyuria: Secondary | ICD-10-CM | POA: Diagnosis not present

## 2020-10-09 DIAGNOSIS — R5383 Other fatigue: Secondary | ICD-10-CM | POA: Diagnosis not present

## 2020-10-09 DIAGNOSIS — Z0000 Encounter for general adult medical examination without abnormal findings: Secondary | ICD-10-CM | POA: Diagnosis not present

## 2020-10-09 DIAGNOSIS — E1165 Type 2 diabetes mellitus with hyperglycemia: Secondary | ICD-10-CM | POA: Diagnosis not present

## 2020-10-31 DIAGNOSIS — R350 Frequency of micturition: Secondary | ICD-10-CM | POA: Diagnosis not present

## 2020-10-31 DIAGNOSIS — M79661 Pain in right lower leg: Secondary | ICD-10-CM | POA: Diagnosis not present

## 2020-11-02 DIAGNOSIS — M79661 Pain in right lower leg: Secondary | ICD-10-CM | POA: Diagnosis not present

## 2021-02-18 DIAGNOSIS — N1831 Chronic kidney disease, stage 3a: Secondary | ICD-10-CM | POA: Diagnosis not present

## 2021-02-18 DIAGNOSIS — E1165 Type 2 diabetes mellitus with hyperglycemia: Secondary | ICD-10-CM | POA: Diagnosis not present

## 2021-02-18 DIAGNOSIS — J0100 Acute maxillary sinusitis, unspecified: Secondary | ICD-10-CM | POA: Diagnosis not present

## 2021-02-18 DIAGNOSIS — M159 Polyosteoarthritis, unspecified: Secondary | ICD-10-CM | POA: Diagnosis not present

## 2021-02-18 DIAGNOSIS — E782 Mixed hyperlipidemia: Secondary | ICD-10-CM | POA: Diagnosis not present

## 2021-02-18 DIAGNOSIS — G43409 Hemiplegic migraine, not intractable, without status migrainosus: Secondary | ICD-10-CM | POA: Diagnosis not present

## 2021-02-18 DIAGNOSIS — I129 Hypertensive chronic kidney disease with stage 1 through stage 4 chronic kidney disease, or unspecified chronic kidney disease: Secondary | ICD-10-CM | POA: Diagnosis not present

## 2021-02-18 DIAGNOSIS — R262 Difficulty in walking, not elsewhere classified: Secondary | ICD-10-CM | POA: Diagnosis not present

## 2021-02-18 DIAGNOSIS — F0390 Unspecified dementia without behavioral disturbance: Secondary | ICD-10-CM | POA: Diagnosis not present

## 2021-02-18 DIAGNOSIS — F32A Depression, unspecified: Secondary | ICD-10-CM | POA: Diagnosis not present

## 2021-02-18 DIAGNOSIS — R29898 Other symptoms and signs involving the musculoskeletal system: Secondary | ICD-10-CM | POA: Diagnosis not present

## 2021-03-18 DIAGNOSIS — G43409 Hemiplegic migraine, not intractable, without status migrainosus: Secondary | ICD-10-CM | POA: Diagnosis not present

## 2021-03-18 DIAGNOSIS — E1165 Type 2 diabetes mellitus with hyperglycemia: Secondary | ICD-10-CM | POA: Diagnosis not present

## 2021-03-18 DIAGNOSIS — J45909 Unspecified asthma, uncomplicated: Secondary | ICD-10-CM | POA: Diagnosis not present

## 2021-03-18 DIAGNOSIS — K219 Gastro-esophageal reflux disease without esophagitis: Secondary | ICD-10-CM | POA: Diagnosis not present

## 2021-03-18 DIAGNOSIS — G311 Senile degeneration of brain, not elsewhere classified: Secondary | ICD-10-CM | POA: Diagnosis not present

## 2021-03-18 DIAGNOSIS — E538 Deficiency of other specified B group vitamins: Secondary | ICD-10-CM | POA: Diagnosis not present

## 2021-03-18 DIAGNOSIS — G4089 Other seizures: Secondary | ICD-10-CM | POA: Diagnosis not present

## 2021-03-18 DIAGNOSIS — M1389 Other specified arthritis, multiple sites: Secondary | ICD-10-CM | POA: Diagnosis not present

## 2021-03-18 DIAGNOSIS — I10 Essential (primary) hypertension: Secondary | ICD-10-CM | POA: Diagnosis not present

## 2021-03-18 DIAGNOSIS — Z7982 Long term (current) use of aspirin: Secondary | ICD-10-CM | POA: Diagnosis not present

## 2021-03-18 DIAGNOSIS — G8191 Hemiplegia, unspecified affecting right dominant side: Secondary | ICD-10-CM | POA: Diagnosis not present

## 2021-03-18 DIAGNOSIS — E782 Mixed hyperlipidemia: Secondary | ICD-10-CM | POA: Diagnosis not present

## 2021-03-18 DIAGNOSIS — J0100 Acute maxillary sinusitis, unspecified: Secondary | ICD-10-CM | POA: Diagnosis not present

## 2021-03-18 DIAGNOSIS — R5381 Other malaise: Secondary | ICD-10-CM | POA: Diagnosis not present

## 2021-03-18 DIAGNOSIS — E559 Vitamin D deficiency, unspecified: Secondary | ICD-10-CM | POA: Diagnosis not present

## 2021-03-18 DIAGNOSIS — R262 Difficulty in walking, not elsewhere classified: Secondary | ICD-10-CM | POA: Diagnosis not present

## 2021-03-18 DIAGNOSIS — G3184 Mild cognitive impairment, so stated: Secondary | ICD-10-CM | POA: Diagnosis not present

## 2021-03-18 DIAGNOSIS — F3289 Other specified depressive episodes: Secondary | ICD-10-CM | POA: Diagnosis not present

## 2021-04-14 DIAGNOSIS — J45909 Unspecified asthma, uncomplicated: Secondary | ICD-10-CM | POA: Diagnosis not present

## 2021-04-14 DIAGNOSIS — G4089 Other seizures: Secondary | ICD-10-CM | POA: Diagnosis not present

## 2021-04-14 DIAGNOSIS — E1165 Type 2 diabetes mellitus with hyperglycemia: Secondary | ICD-10-CM | POA: Diagnosis not present

## 2021-04-14 DIAGNOSIS — E559 Vitamin D deficiency, unspecified: Secondary | ICD-10-CM | POA: Diagnosis not present

## 2021-04-14 DIAGNOSIS — I10 Essential (primary) hypertension: Secondary | ICD-10-CM | POA: Diagnosis not present

## 2021-04-14 DIAGNOSIS — J0100 Acute maxillary sinusitis, unspecified: Secondary | ICD-10-CM | POA: Diagnosis not present

## 2021-04-14 DIAGNOSIS — G311 Senile degeneration of brain, not elsewhere classified: Secondary | ICD-10-CM | POA: Diagnosis not present

## 2021-04-14 DIAGNOSIS — G3184 Mild cognitive impairment, so stated: Secondary | ICD-10-CM | POA: Diagnosis not present

## 2021-04-14 DIAGNOSIS — E538 Deficiency of other specified B group vitamins: Secondary | ICD-10-CM | POA: Diagnosis not present

## 2021-04-14 DIAGNOSIS — F3289 Other specified depressive episodes: Secondary | ICD-10-CM | POA: Diagnosis not present

## 2021-04-14 DIAGNOSIS — M1389 Other specified arthritis, multiple sites: Secondary | ICD-10-CM | POA: Diagnosis not present

## 2021-04-14 DIAGNOSIS — Z7982 Long term (current) use of aspirin: Secondary | ICD-10-CM | POA: Diagnosis not present

## 2021-04-14 DIAGNOSIS — G43409 Hemiplegic migraine, not intractable, without status migrainosus: Secondary | ICD-10-CM | POA: Diagnosis not present

## 2021-04-14 DIAGNOSIS — K219 Gastro-esophageal reflux disease without esophagitis: Secondary | ICD-10-CM | POA: Diagnosis not present

## 2021-04-14 DIAGNOSIS — G8191 Hemiplegia, unspecified affecting right dominant side: Secondary | ICD-10-CM | POA: Diagnosis not present

## 2021-04-14 DIAGNOSIS — R262 Difficulty in walking, not elsewhere classified: Secondary | ICD-10-CM | POA: Diagnosis not present

## 2021-04-14 DIAGNOSIS — R5381 Other malaise: Secondary | ICD-10-CM | POA: Diagnosis not present

## 2021-04-14 DIAGNOSIS — E782 Mixed hyperlipidemia: Secondary | ICD-10-CM | POA: Diagnosis not present

## 2021-05-25 DIAGNOSIS — E1351 Other specified diabetes mellitus with diabetic peripheral angiopathy without gangrene: Secondary | ICD-10-CM | POA: Diagnosis not present

## 2021-05-25 DIAGNOSIS — L602 Onychogryphosis: Secondary | ICD-10-CM | POA: Diagnosis not present

## 2021-05-25 DIAGNOSIS — R531 Weakness: Secondary | ICD-10-CM | POA: Diagnosis not present

## 2021-05-25 DIAGNOSIS — L84 Corns and callosities: Secondary | ICD-10-CM | POA: Diagnosis not present

## 2021-05-25 DIAGNOSIS — M21611 Bunion of right foot: Secondary | ICD-10-CM | POA: Diagnosis not present

## 2021-05-25 DIAGNOSIS — M21612 Bunion of left foot: Secondary | ICD-10-CM | POA: Diagnosis not present

## 2021-05-25 DIAGNOSIS — M792 Neuralgia and neuritis, unspecified: Secondary | ICD-10-CM | POA: Diagnosis not present

## 2021-05-28 DIAGNOSIS — E538 Deficiency of other specified B group vitamins: Secondary | ICD-10-CM | POA: Diagnosis not present

## 2021-05-28 DIAGNOSIS — E1165 Type 2 diabetes mellitus with hyperglycemia: Secondary | ICD-10-CM | POA: Diagnosis not present

## 2021-05-28 DIAGNOSIS — R5383 Other fatigue: Secondary | ICD-10-CM | POA: Diagnosis not present

## 2021-05-28 DIAGNOSIS — R1115 Cyclical vomiting syndrome unrelated to migraine: Secondary | ICD-10-CM | POA: Diagnosis not present

## 2024-02-20 ENCOUNTER — Encounter: Payer: Self-pay | Admitting: Gastroenterology
